# Patient Record
Sex: Female | Born: 1955 | Race: White | Hispanic: No | Smoking: Never smoker
Health system: Southern US, Community
[De-identification: ages and names within clinical notes are randomized; demographics above are authoritative.]

## PROBLEM LIST (undated history)

## (undated) DIAGNOSIS — N289 Disorder of kidney and ureter, unspecified: Secondary | ICD-10-CM

## (undated) DIAGNOSIS — N139 Obstructive and reflux uropathy, unspecified: Secondary | ICD-10-CM

## (undated) DIAGNOSIS — E119 Type 2 diabetes mellitus without complications: Secondary | ICD-10-CM

## (undated) DIAGNOSIS — I10 Essential (primary) hypertension: Secondary | ICD-10-CM

## (undated) DIAGNOSIS — K9413 Enterostomy malfunction: Secondary | ICD-10-CM

## (undated) HISTORY — PX: SMALL INTESTINE SURGERY: SHX150

## (undated) HISTORY — PX: ILEOSTOMY: SHX1783

## (undated) HISTORY — PX: CHOLECYSTECTOMY: SHX55

---

## 2004-05-03 ENCOUNTER — Ambulatory Visit: Payer: Self-pay | Admitting: Internal Medicine

## 2004-10-19 ENCOUNTER — Emergency Department: Payer: Self-pay | Admitting: Emergency Medicine

## 2005-05-04 ENCOUNTER — Emergency Department: Payer: Self-pay | Admitting: Emergency Medicine

## 2005-05-16 ENCOUNTER — Inpatient Hospital Stay: Payer: Self-pay | Admitting: Internal Medicine

## 2005-06-07 ENCOUNTER — Emergency Department: Payer: Self-pay | Admitting: Emergency Medicine

## 2005-10-31 ENCOUNTER — Other Ambulatory Visit: Payer: Self-pay

## 2005-10-31 ENCOUNTER — Emergency Department: Payer: Self-pay | Admitting: Unknown Physician Specialty

## 2005-11-02 ENCOUNTER — Other Ambulatory Visit: Payer: Self-pay

## 2005-11-02 ENCOUNTER — Inpatient Hospital Stay: Payer: Self-pay | Admitting: Internal Medicine

## 2005-11-03 ENCOUNTER — Other Ambulatory Visit: Payer: Self-pay

## 2006-01-28 ENCOUNTER — Emergency Department: Payer: Self-pay | Admitting: Emergency Medicine

## 2007-08-20 ENCOUNTER — Ambulatory Visit: Payer: Self-pay | Admitting: Internal Medicine

## 2007-09-08 ENCOUNTER — Inpatient Hospital Stay: Payer: Self-pay | Admitting: Internal Medicine

## 2007-09-08 ENCOUNTER — Ambulatory Visit: Payer: Self-pay | Admitting: Family Medicine

## 2007-09-08 ENCOUNTER — Other Ambulatory Visit: Payer: Self-pay

## 2009-11-04 IMAGING — CR DG CHEST 2V
1 series · 2 of 2 positions shown · non-contrast
Comparison: none

REASON FOR EXAM: congestive heart failure
COMMENTS:

[Series 1: view not recorded · 0.17mm/px · 2 of 2 slices shown]
[im 1/2]
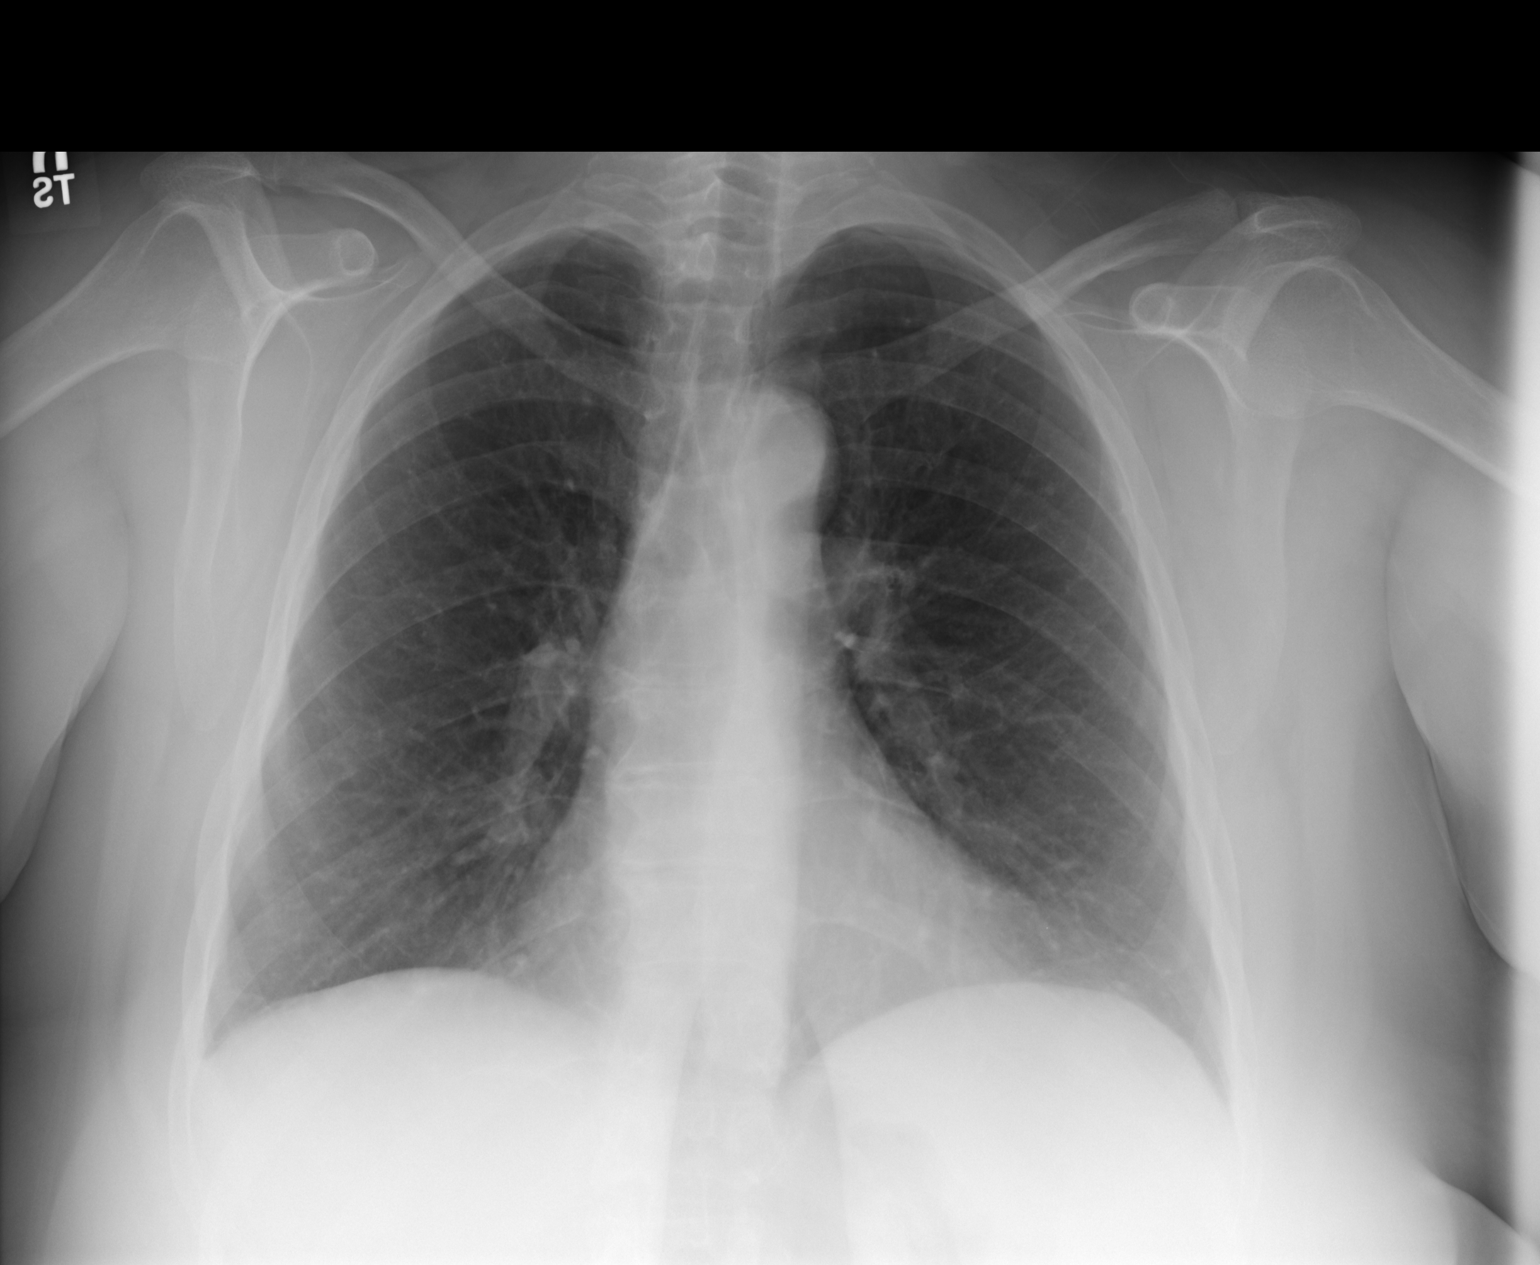
[im 2/2]
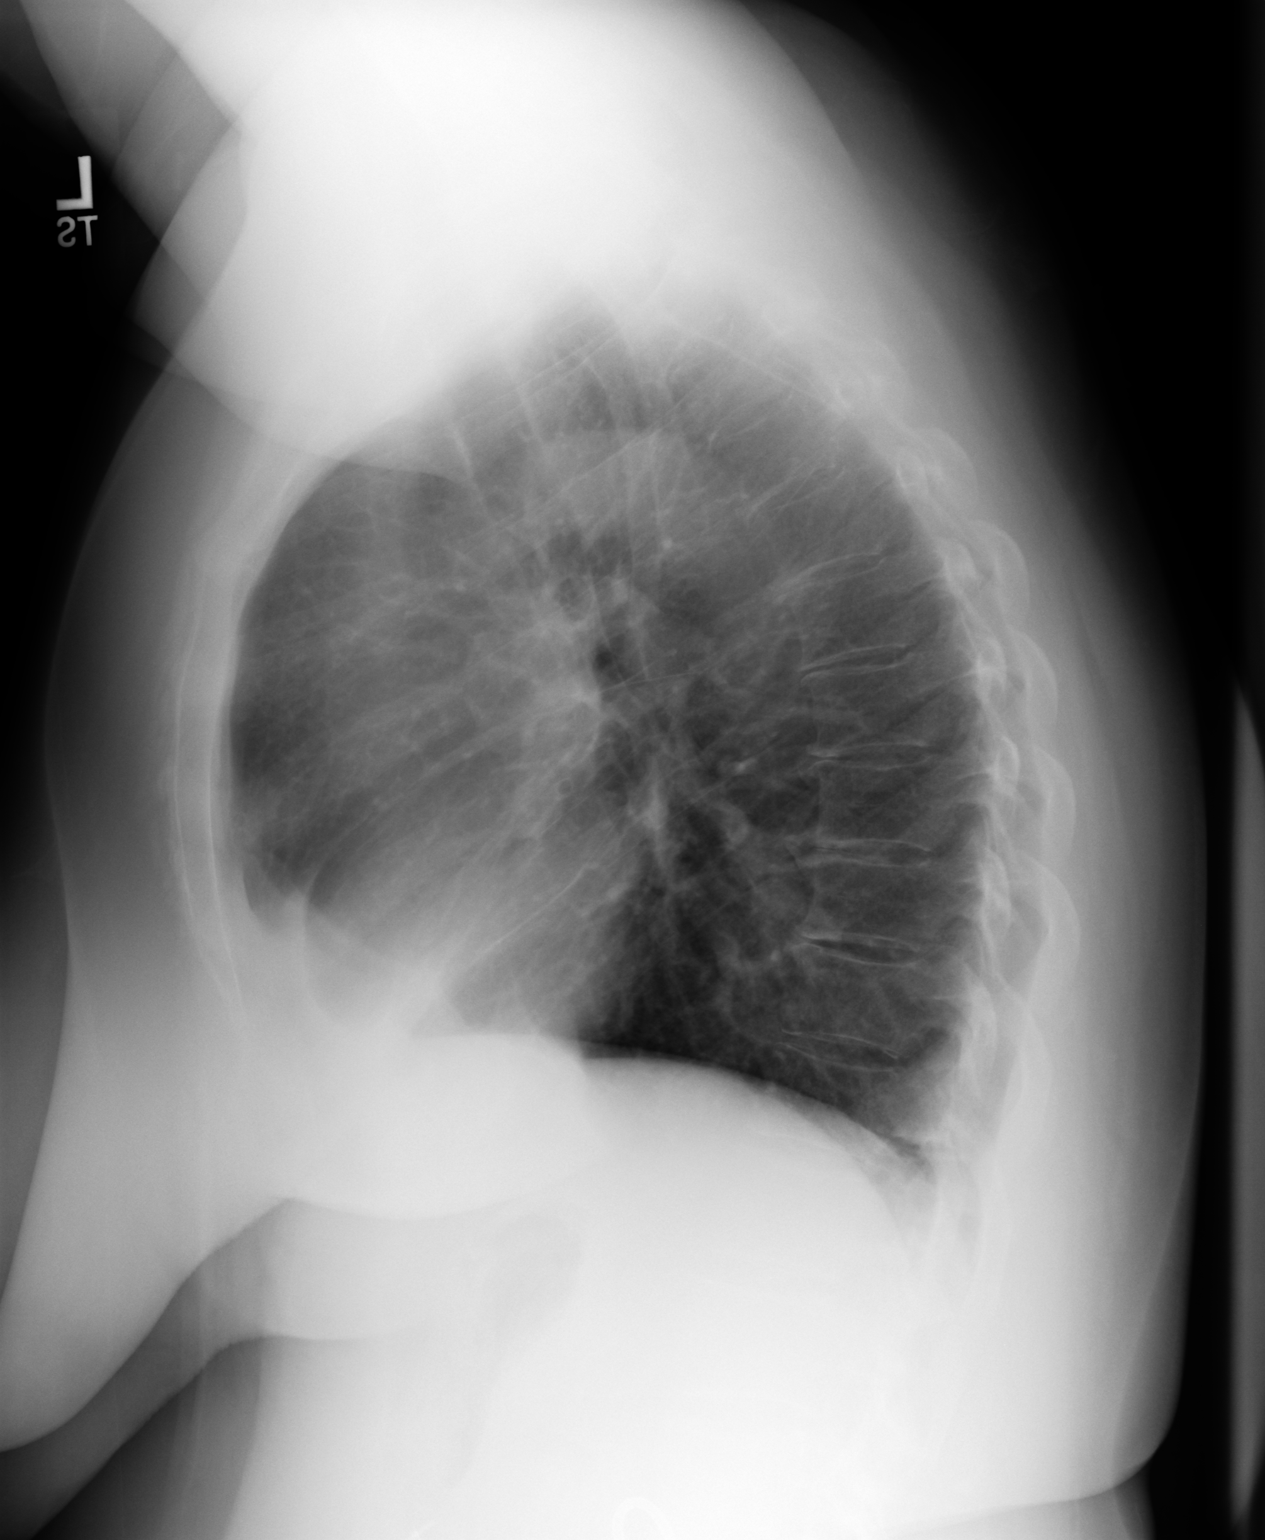

[2 of 2 positions shown; findings below may reference images not displayed]

PROCEDURE:     DXR - DXR CHEST PA (OR AP) AND LATERAL  - August 20, 2007 [DATE]

RESULT:     Comparison is made to a study 02 November, 2005.

The lungs are adequately inflated.  There is a suggestion of mild
hyperinflation with hemidiaphragm flattening on the lateral film. The heart
is normal in size. The pulmonary vascularity is not engorged. There is no
pleural effusion.
IMPRESSION: There is very mild hyperinflation which may be voluntary or
which could reflect an element of underlying COPD or reactive airway
disease. I see no overt evidence of CHF.

## 2011-05-14 ENCOUNTER — Encounter: Payer: Self-pay | Admitting: Family Medicine

## 2012-11-14 ENCOUNTER — Emergency Department: Payer: Self-pay | Admitting: Emergency Medicine

## 2015-06-14 ENCOUNTER — Ambulatory Visit
Admission: EM | Admit: 2015-06-14 | Discharge: 2015-06-14 | Disposition: A | Discharge status: Home / Self Care | Payer: Medicare Other | Attending: Family Medicine | Admitting: Family Medicine

## 2015-06-14 DIAGNOSIS — M79602 Pain in left arm: Secondary | ICD-10-CM | POA: Diagnosis not present

## 2015-06-14 DIAGNOSIS — M7989 Other specified soft tissue disorders: Secondary | ICD-10-CM

## 2015-06-14 HISTORY — DX: Obstructive and reflux uropathy, unspecified: N13.9

## 2015-06-14 HISTORY — DX: Type 2 diabetes mellitus without complications: E11.9

## 2015-06-14 HISTORY — DX: Essential (primary) hypertension: I10

## 2015-06-14 HISTORY — DX: Enterostomy malfunction: K94.13

## 2015-06-14 NOTE — ED Provider Notes (Addendum)
CSN: [id number]     Arrival date & time 06/14/15  1446 History   First MD Initiated Contact with Patient 06/14/15 1640     Chief Complaint  Patient presents with  . Arm Swelling    Left Arm  . Arm Pain    Left Arm   (Consider location/radiation/quality/duration/timing/severity/associated sxs/prior Treatment) HPI: Patient presents today with symptoms of left arm swelling and pain. Patient states that she noticed these symptoms yesterday. She denies any injury or fall. She denies any history of DVT or PE in the past. She denies any chest pain or shortness of breath. She denies any fever.  Past Medical History  Diagnosis Date  . Acute bilateral obstructive uropathy   . 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 Hypertension   . Diabetes mellitus without complication [facility])    Past Surgical History  Procedure Laterality Date  . Ileostomy    . Small intestine surgery    . Cholecystectomy     History reviewed. No pertinent family history. Social History  Substance Use Topics  . Smoking status: Never Smoker   . Smokeless tobacco: Never Used  . Alcohol Use: No   OB History    No data available     Review of Systems: Negative except mentioned above.   Allergies  Dilaudid; Keflex; Morphine and related; Biaxin; and Penicillins  Home Medications   Prior to Admission medications   Medication Sig Start Date End Date Taking? Authorizing Provider  amLODipine (NORVASC) 5 MG tablet Take 5 mg by mouth daily.   Yes Historical Provider, MD  aspirin 81 MG tablet Take 81 mg by mouth daily.   Yes Historical Provider, MD  calcium acetate (PHOSLO) 667 MG tablet Take 667 mg by mouth 2 (two) times daily.   Yes Historical Provider, MD  Calcium Carbonate-Vitamin D (CALCIUM 500 + D PO) Take 1 tablet by mouth daily.   Yes Historical Provider, MD  FERROUS SULFATE ER PO Take 65 mg by mouth daily.   Yes Historical Provider, MD  furosemide (LASIX) 40 MG tablet Take 40 mg by mouth 2 (two) times daily.   Yes  Historical Provider, MD  gabapentin (NEURONTIN) 300 MG capsule Take 300 mg by mouth at bedtime.   Yes Historical Provider, MD  glucagon (GLUCAGON EMERGENCY) 1 MG injection Inject 1 mg into the vein once as needed.   Yes Historical Provider, MD  insulin aspart (NOVOLOG) 100 UNIT/ML FlexPen Inject 14 Units into the skin 2 (two) times daily.   Yes Historical Provider, MD  insulin glargine (LANTUS) 100 UNIT/ML injection Inject 14 Units into the skin at bedtime.   Yes Historical Provider, MD  insulin NPH-regular Human (NOVOLIN 70/30) (70-30) 100 UNIT/ML injection Inject 38 Units into the skin daily with breakfast.   Yes Historical Provider, MD  insulin NPH-regular Human (NOVOLIN 70/30) (70-30) 100 UNIT/ML injection Inject 14 Units into the skin daily with supper.   Yes Historical Provider, MD  loperamide (IMODIUM) 2 MG capsule Take 2 mg by mouth as needed for diarrhea or loose stools.   Yes Historical Provider, MD  magnesium oxide (MAG-OX) 400 MG tablet Take 400 mg by mouth daily.   Yes Historical Provider, MD  metoprolol succinate (TOPROL-XL) 25 MG 24 hr tablet Take 25 mg by mouth daily.   Yes Historical Provider, MD  Multiple Vitamin (MULTIVITAMIN) tablet Take 1 tablet by mouth daily.   Yes Historical Provider, MD  omeprazole (PRILOSEC) 20 MG capsule Take 20 mg by mouth daily.   Yes Historical  Provider, MD  predniSONE (DELTASONE) 50 MG tablet Take 50 mg by mouth as directed.   Yes Historical Provider, MD  sodium bicarbonate 650 MG tablet Take 650 mg by mouth 2 (two) times daily.   Yes Historical Provider, MD   Meds Ordered and Administered this Visit  Medications - No data to display  BP 131/74 mmHg  Pulse 80  Temp(Src) 98.2 F (36.8 C) (Oral)  Resp 18  Ht 5\' 1"  (1.549 m)  Wt 207 lb (93.895 kg)  BMI 39.13 kg/m2  SpO2 99% No data found.   Physical Exam:  GENERAL: NAD RESP: CTA B CARD: RRR MSK: LUE- mild swelling and tenderness of LUE, some discomfort of LUE with ROM, nv intact  NEURO:  CN II-XII grossly intact   ED Course  Procedures (including critical care time)  Labs Review Labs Reviewed - No data to display  Imaging Review No results found.    MDM  A/P: Left upper extremity pain and swelling-discussed with patient that I would recommend that she get any ultrasound of the extremity at this time to rule out DVT, patient states that she will be going to the ER at this time. She has a family member with her that will take her.    Paulina Fusi, MD 06/14/15 [id number]  Paulina Fusi, MD 06/14/15 [id number]

## 2015-06-14 NOTE — ED Notes (Signed)
Patient c/o left arm swelling and pain which she noticed yesterday.  No injury or fall.  Denies fever c/n/v or chest pain.

## 2019-11-01 ENCOUNTER — Inpatient Hospital Stay: Payer: Medicare Other

## 2019-11-01 ENCOUNTER — Other Ambulatory Visit: Payer: Self-pay

## 2019-11-01 ENCOUNTER — Encounter: Payer: Self-pay | Admitting: Emergency Medicine

## 2019-11-01 ENCOUNTER — Inpatient Hospital Stay
Admission: EM | Admit: 2019-11-01 | Discharge: 2019-11-24 | DRG: 871 | Disposition: E | Payer: Medicare Other | Attending: Internal Medicine | Admitting: Internal Medicine

## 2019-11-01 ENCOUNTER — Emergency Department: Payer: Medicare Other

## 2019-11-01 DIAGNOSIS — Z881 Allergy status to other antibiotic agents status: Secondary | ICD-10-CM

## 2019-11-01 DIAGNOSIS — Z794 Long term (current) use of insulin: Secondary | ICD-10-CM

## 2019-11-01 DIAGNOSIS — L89623 Pressure ulcer of left heel, stage 3: Secondary | ICD-10-CM | POA: Diagnosis present

## 2019-11-01 DIAGNOSIS — Z992 Dependence on renal dialysis: Secondary | ICD-10-CM

## 2019-11-01 DIAGNOSIS — A419 Sepsis, unspecified organism: Principal | ICD-10-CM | POA: Diagnosis present

## 2019-11-01 DIAGNOSIS — Z6839 Body mass index (BMI) 39.0-39.9, adult: Secondary | ICD-10-CM | POA: Diagnosis not present

## 2019-11-01 DIAGNOSIS — N139 Obstructive and reflux uropathy, unspecified: Secondary | ICD-10-CM | POA: Diagnosis present

## 2019-11-01 DIAGNOSIS — E1140 Type 2 diabetes mellitus with diabetic neuropathy, unspecified: Secondary | ICD-10-CM | POA: Diagnosis present

## 2019-11-01 DIAGNOSIS — Z7982 Long term (current) use of aspirin: Secondary | ICD-10-CM

## 2019-11-01 DIAGNOSIS — Z20822 Contact with and (suspected) exposure to covid-19: Secondary | ICD-10-CM | POA: Diagnosis present

## 2019-11-01 DIAGNOSIS — J189 Pneumonia, unspecified organism: Secondary | ICD-10-CM | POA: Diagnosis present

## 2019-11-01 DIAGNOSIS — M4628 Osteomyelitis of vertebra, sacral and sacrococcygeal region: Secondary | ICD-10-CM | POA: Diagnosis present

## 2019-11-01 DIAGNOSIS — H9190 Unspecified hearing loss, unspecified ear: Secondary | ICD-10-CM | POA: Diagnosis present

## 2019-11-01 DIAGNOSIS — N186 End stage renal disease: Secondary | ICD-10-CM | POA: Diagnosis present

## 2019-11-01 DIAGNOSIS — E872 Acidosis, unspecified: Secondary | ICD-10-CM

## 2019-11-01 DIAGNOSIS — I429 Cardiomyopathy, unspecified: Secondary | ICD-10-CM | POA: Diagnosis present

## 2019-11-01 DIAGNOSIS — R54 Age-related physical debility: Secondary | ICD-10-CM | POA: Diagnosis present

## 2019-11-01 DIAGNOSIS — E1165 Type 2 diabetes mellitus with hyperglycemia: Secondary | ICD-10-CM | POA: Diagnosis present

## 2019-11-01 DIAGNOSIS — Z515 Encounter for palliative care: Secondary | ICD-10-CM | POA: Diagnosis not present

## 2019-11-01 DIAGNOSIS — Z7952 Long term (current) use of systemic steroids: Secondary | ICD-10-CM

## 2019-11-01 DIAGNOSIS — E1122 Type 2 diabetes mellitus with diabetic chronic kidney disease: Secondary | ICD-10-CM | POA: Diagnosis present

## 2019-11-01 DIAGNOSIS — R000 Tachycardia, unspecified: Secondary | ICD-10-CM | POA: Diagnosis present

## 2019-11-01 DIAGNOSIS — N2581 Secondary hyperparathyroidism of renal origin: Secondary | ICD-10-CM | POA: Diagnosis present

## 2019-11-01 DIAGNOSIS — I120 Hypertensive chronic kidney disease with stage 5 chronic kidney disease or end stage renal disease: Secondary | ICD-10-CM | POA: Diagnosis present

## 2019-11-01 DIAGNOSIS — E1169 Type 2 diabetes mellitus with other specified complication: Secondary | ICD-10-CM | POA: Diagnosis present

## 2019-11-01 DIAGNOSIS — I469 Cardiac arrest, cause unspecified: Secondary | ICD-10-CM | POA: Diagnosis not present

## 2019-11-01 DIAGNOSIS — D684 Acquired coagulation factor deficiency: Secondary | ICD-10-CM | POA: Diagnosis present

## 2019-11-01 DIAGNOSIS — R579 Shock, unspecified: Secondary | ICD-10-CM | POA: Diagnosis not present

## 2019-11-01 DIAGNOSIS — R6521 Severe sepsis with septic shock: Secondary | ICD-10-CM | POA: Diagnosis present

## 2019-11-01 DIAGNOSIS — Z9115 Patient's noncompliance with renal dialysis: Secondary | ICD-10-CM | POA: Diagnosis not present

## 2019-11-01 DIAGNOSIS — Z880 Allergy status to penicillin: Secondary | ICD-10-CM

## 2019-11-01 DIAGNOSIS — S31000A Unspecified open wound of lower back and pelvis without penetration into retroperitoneum, initial encounter: Secondary | ICD-10-CM

## 2019-11-01 DIAGNOSIS — L89150 Pressure ulcer of sacral region, unstageable: Secondary | ICD-10-CM | POA: Diagnosis present

## 2019-11-01 DIAGNOSIS — E875 Hyperkalemia: Secondary | ICD-10-CM | POA: Diagnosis present

## 2019-11-01 DIAGNOSIS — E8779 Other fluid overload: Secondary | ICD-10-CM

## 2019-11-01 DIAGNOSIS — J9600 Acute respiratory failure, unspecified whether with hypoxia or hypercapnia: Secondary | ICD-10-CM | POA: Diagnosis not present

## 2019-11-01 DIAGNOSIS — Z7189 Other specified counseling: Secondary | ICD-10-CM

## 2019-11-01 DIAGNOSIS — Z885 Allergy status to narcotic agent status: Secondary | ICD-10-CM

## 2019-11-01 DIAGNOSIS — D631 Anemia in chronic kidney disease: Secondary | ICD-10-CM | POA: Diagnosis present

## 2019-11-01 DIAGNOSIS — I82409 Acute embolism and thrombosis of unspecified deep veins of unspecified lower extremity: Secondary | ICD-10-CM

## 2019-11-01 DIAGNOSIS — Z932 Ileostomy status: Secondary | ICD-10-CM

## 2019-11-01 DIAGNOSIS — Z79899 Other long term (current) drug therapy: Secondary | ICD-10-CM

## 2019-11-01 DIAGNOSIS — Z9181 History of falling: Secondary | ICD-10-CM

## 2019-11-01 HISTORY — DX: Disorder of kidney and ureter, unspecified: N28.9

## 2019-11-01 LAB — BLOOD GAS, ARTERIAL
Acid-base deficit: 13.6 mmol/L — ABNORMAL HIGH (ref 0.0–2.0)
Bicarbonate: 11.5 mmol/L — ABNORMAL LOW (ref 20.0–28.0)
FIO2: 0.21
O2 Saturation: 97 %
Patient temperature: 37
pCO2 arterial: 24 mmHg — ABNORMAL LOW (ref 32.0–48.0)
pH, Arterial: 7.29 — ABNORMAL LOW (ref 7.350–7.450)
pO2, Arterial: 101 mmHg (ref 83.0–108.0)

## 2019-11-01 LAB — HEPATIC FUNCTION PANEL
ALT: 14 U/L (ref 0–44)
AST: 24 U/L (ref 15–41)
Albumin: 2.2 g/dL — ABNORMAL LOW (ref 3.5–5.0)
Alkaline Phosphatase: 470 U/L — ABNORMAL HIGH (ref 38–126)
Bilirubin, Direct: 0.4 mg/dL — ABNORMAL HIGH (ref 0.0–0.2)
Indirect Bilirubin: 0.9 mg/dL (ref 0.3–0.9)
Total Bilirubin: 1.3 mg/dL — ABNORMAL HIGH (ref 0.3–1.2)
Total Protein: 5.6 g/dL — ABNORMAL LOW (ref 6.5–8.1)

## 2019-11-01 LAB — CREATININE, SERUM
Creatinine, Ser: 6.56 mg/dL — ABNORMAL HIGH (ref 0.44–1.00)
GFR calc Af Amer: 7 mL/min — ABNORMAL LOW (ref >60)
GFR calc non Af Amer: 6 mL/min — ABNORMAL LOW (ref >60)

## 2019-11-01 LAB — CBC
HCT: 29.1 % — ABNORMAL LOW (ref 36.0–46.0)
HCT: 29.4 % — ABNORMAL LOW (ref 36.0–46.0)
Hemoglobin: 9.2 g/dL — ABNORMAL LOW (ref 12.0–15.0)
Hemoglobin: 9.3 g/dL — ABNORMAL LOW (ref 12.0–15.0)
MCH: 34.2 pg — ABNORMAL HIGH (ref 26.0–34.0)
MCH: 33.8 pg (ref 26.0–34.0)
MCHC: 31.6 g/dL (ref 30.0–36.0)
MCHC: 31.6 g/dL (ref 30.0–36.0)
MCV: 108.2 fL — ABNORMAL HIGH (ref 80.0–100.0)
MCV: 106.9 fL — ABNORMAL HIGH (ref 80.0–100.0)
Platelets: 386 10*3/uL (ref 150–400)
Platelets: 464 10*3/uL — ABNORMAL HIGH (ref 150–400)
RBC: 2.69 MIL/uL — ABNORMAL LOW (ref 3.87–5.11)
RBC: 2.75 MIL/uL — ABNORMAL LOW (ref 3.87–5.11)
RDW: 15.8 % — ABNORMAL HIGH (ref 11.5–15.5)
RDW: 15.7 % — ABNORMAL HIGH (ref 11.5–15.5)
WBC: 29.2 10*3/uL — ABNORMAL HIGH (ref 4.0–10.5)
WBC: 32 10*3/uL — ABNORMAL HIGH (ref 4.0–10.5)
nRBC: 0 % (ref 0.0–0.2)
nRBC: 0 % (ref 0.0–0.2)

## 2019-11-01 LAB — BASIC METABOLIC PANEL
Anion gap: 20 — ABNORMAL HIGH (ref 5–15)
Anion gap: 17 — ABNORMAL HIGH (ref 5–15)
BUN: 35 mg/dL — ABNORMAL HIGH (ref 8–23)
BUN: 36 mg/dL — ABNORMAL HIGH (ref 8–23)
CO2: 13 mmol/L — ABNORMAL LOW (ref 22–32)
CO2: 18 mmol/L — ABNORMAL LOW (ref 22–32)
Calcium: 8.3 mg/dL — ABNORMAL LOW (ref 8.9–10.3)
Calcium: 8.7 mg/dL — ABNORMAL LOW (ref 8.9–10.3)
Chloride: 93 mmol/L — ABNORMAL LOW (ref 98–111)
Chloride: 96 mmol/L — ABNORMAL LOW (ref 98–111)
Creatinine, Ser: 6.37 mg/dL — ABNORMAL HIGH (ref 0.44–1.00)
Creatinine, Ser: 6.45 mg/dL — ABNORMAL HIGH (ref 0.44–1.00)
GFR calc Af Amer: 7 mL/min — ABNORMAL LOW (ref >60)
GFR calc Af Amer: 7 mL/min — ABNORMAL LOW (ref >60)
GFR calc non Af Amer: 6 mL/min — ABNORMAL LOW (ref >60)
GFR calc non Af Amer: 6 mL/min — ABNORMAL LOW (ref >60)
Glucose, Bld: 168 mg/dL — ABNORMAL HIGH (ref 70–99)
Glucose, Bld: 105 mg/dL — ABNORMAL HIGH (ref 70–99)
Potassium: 5.3 mmol/L — ABNORMAL HIGH (ref 3.5–5.1)
Potassium: 5.5 mmol/L — ABNORMAL HIGH (ref 3.5–5.1)
Sodium: 126 mmol/L — ABNORMAL LOW (ref 135–145)
Sodium: 131 mmol/L — ABNORMAL LOW (ref 135–145)

## 2019-11-01 LAB — LACTIC ACID, PLASMA
Lactic Acid, Venous: 5.8 mmol/L (ref 0.5–1.9)
Lactic Acid, Venous: 3.7 mmol/L (ref 0.5–1.9)
Lactic Acid, Venous: 4 mmol/L (ref 0.5–1.9)
Lactic Acid, Venous: 3.3 mmol/L (ref 0.5–1.9)

## 2019-11-01 LAB — HEMOGLOBIN A1C
Hgb A1c MFr Bld: 4.7 % — ABNORMAL LOW (ref 4.8–5.6)
Mean Plasma Glucose: 88 mg/dL

## 2019-11-01 LAB — MRSA PCR SCREENING: MRSA by PCR: NEGATIVE

## 2019-11-01 LAB — TSH: TSH: 11.518 u[IU]/mL — ABNORMAL HIGH (ref 0.350–4.500)

## 2019-11-01 LAB — HEPATITIS B SURFACE ANTIBODY,QUALITATIVE: Hep B S Ab: NONREACTIVE

## 2019-11-01 LAB — PROTIME-INR
INR: 4.2 (ref 0.8–1.2)
Prothrombin Time: 39.2 seconds — ABNORMAL HIGH (ref 11.4–15.2)

## 2019-11-01 LAB — HIV ANTIBODY (ROUTINE TESTING W REFLEX): HIV Screen 4th Generation wRfx: NONREACTIVE

## 2019-11-01 LAB — PROCALCITONIN: Procalcitonin: 1.69 ng/mL

## 2019-11-01 LAB — GLUCOSE, CAPILLARY: Glucose-Capillary: 95 mg/dL (ref 70–99)

## 2019-11-01 LAB — HEPATITIS B SURFACE ANTIGEN: Hepatitis B Surface Ag: NONREACTIVE

## 2019-11-01 LAB — SARS CORONAVIRUS 2 BY RT PCR (HOSPITAL ORDER, PERFORMED IN ~~LOC~~ HOSPITAL LAB): SARS Coronavirus 2: NEGATIVE

## 2019-11-01 LAB — APTT: aPTT: 57 seconds — ABNORMAL HIGH (ref 24–36)

## 2019-11-01 MED ORDER — NOREPINEPHRINE 4 MG/250ML-% IV SOLN
INTRAVENOUS | Status: AC
  Filled 2019-11-01: qty 250

## 2019-11-01 MED ORDER — DOCUSATE SODIUM 100 MG PO CAPS: 100.0000 mg | ORAL_CAPSULE | Freq: Two times a day (BID) | ORAL | Status: DC | PRN

## 2019-11-01 MED ORDER — FENTANYL CITRATE (PF) 100 MCG/2ML IJ SOLN
INTRAMUSCULAR | Status: AC
  Administered 2019-11-01: 12.5 ug via INTRAVENOUS
  Filled 2019-11-01: qty 2

## 2019-11-01 MED ORDER — PRISMASOL BGK 0/2.5 32-2.5 MEQ/L IV SOLN
INTRAVENOUS | Status: DC
  Filled 2019-11-01: qty 5000

## 2019-11-01 MED ORDER — VANCOMYCIN HCL 1750 MG/350ML IV SOLN
1750.0000 mg | Freq: Once | INTRAVENOUS | Status: DC
  Filled 2019-11-01: qty 350

## 2019-11-01 MED ORDER — SODIUM BICARBONATE 8.4 % IV SOLN: 100.0000 meq | Freq: Once | INTRAVENOUS | Status: AC

## 2019-11-01 MED ORDER — SODIUM CHLORIDE 0.9 % IV SOLN
2.0000 g | Freq: Once | INTRAVENOUS | Status: AC
  Administered 2019-11-01: 2 g via INTRAVENOUS
  Filled 2019-11-01: qty 2

## 2019-11-01 MED ORDER — POLYETHYLENE GLYCOL 3350 17 G PO PACK: 17.0000 g | PACK | Freq: Every day | ORAL | Status: DC | PRN

## 2019-11-01 MED ORDER — CHLORHEXIDINE GLUCONATE CLOTH 2 % EX PADS: 6.0000 | MEDICATED_PAD | Freq: Every day | CUTANEOUS | Status: DC

## 2019-11-01 MED ORDER — SODIUM CHLORIDE 0.9 % FOR CRRT
INTRAVENOUS_CENTRAL | Status: DC | PRN
  Filled 2019-11-01: qty 1000

## 2019-11-01 MED ORDER — NOREPINEPHRINE 4 MG/250ML-% IV SOLN
0.0000 ug/min | INTRAVENOUS | Status: DC
  Administered 2019-11-01: 15 ug/min via INTRAVENOUS
  Administered 2019-11-01: 5 ug/min via INTRAVENOUS
  Filled 2019-11-01: qty 250

## 2019-11-01 MED ORDER — STERILE WATER FOR INJECTION IV SOLN
INTRAVENOUS | Status: DC
  Filled 2019-11-01 (×4): qty 850

## 2019-11-01 MED ORDER — FENTANYL CITRATE (PF) 100 MCG/2ML IJ SOLN: 12.5000 ug | Freq: Once | INTRAMUSCULAR | Status: AC

## 2019-11-01 MED ORDER — NOREPINEPHRINE 16 MG/250ML-% IV SOLN
0.0000 ug/min | INTRAVENOUS | Status: DC
  Administered 2019-11-01: 15 ug/min via INTRAVENOUS
  Administered 2019-11-02: 20 ug/min via INTRAVENOUS
  Filled 2019-11-01 (×3): qty 250

## 2019-11-01 MED ORDER — SODIUM BICARBONATE 8.4 % IV SOLN
INTRAVENOUS | Status: AC
  Administered 2019-11-01: 100 meq via INTRAVENOUS
  Filled 2019-11-01: qty 100

## 2019-11-01 MED ORDER — FENTANYL CITRATE (PF) 100 MCG/2ML IJ SOLN
25.0000 ug | Freq: Once | INTRAMUSCULAR | Status: AC
  Administered 2019-11-01: 25 ug via INTRAVENOUS
  Filled 2019-11-01: qty 2

## 2019-11-01 MED ORDER — SODIUM BICARBONATE-DEXTROSE 150-5 MEQ/L-% IV SOLN: 150.0000 meq | INTRAVENOUS | Status: DC

## 2019-11-01 MED ORDER — ALBUMIN HUMAN 25 % IV SOLN
50.0000 g | Freq: Once | INTRAVENOUS | Status: AC
  Administered 2019-11-01: 50 g via INTRAVENOUS
  Filled 2019-11-01: qty 200

## 2019-11-01 MED ORDER — MIDODRINE HCL 5 MG PO TABS
10.0000 mg | ORAL_TABLET | Freq: Once | ORAL | Status: DC
  Filled 2019-11-01: qty 2

## 2019-11-01 MED ORDER — SODIUM CHLORIDE 0.9% FLUSH
3.0000 mL | Freq: Once | INTRAVENOUS | Status: AC
  Administered 2019-11-01: 3 mL via INTRAVENOUS

## 2019-11-01 MED ORDER — FENTANYL CITRATE (PF) 100 MCG/2ML IJ SOLN
12.5000 ug | INTRAMUSCULAR | Status: DC | PRN
  Administered 2019-11-02 (×3): 12.5 ug via INTRAVENOUS
  Filled 2019-11-01 (×3): qty 2

## 2019-11-01 MED ORDER — HEPARIN SODIUM (PORCINE) 1000 UNIT/ML DIALYSIS
1000.0000 [IU] | INTRAMUSCULAR | Status: DC | PRN
  Filled 2019-11-01: qty 6

## 2019-11-01 MED ORDER — INSULIN ASPART 100 UNIT/ML ~~LOC~~ SOLN: 1.0000 [IU] | SUBCUTANEOUS | Status: DC

## 2019-11-01 MED ORDER — HEPARIN SODIUM (PORCINE) 5000 UNIT/ML IJ SOLN: 5000.0000 [IU] | Freq: Three times a day (TID) | INTRAMUSCULAR | Status: DC

## 2019-11-01 NOTE — Progress Notes (Signed)
Brief Pharmacy Note  Patient to start on CRRT. Review of current medications ordered. No medications requiring adjustment at this time. Will continue to monitor.  Dorena Bodo, PharmD

## 2019-11-01 NOTE — ED Provider Notes (Signed)
[facility] Emergency Department Provider Note ____________________________________________   First MD Initiated Contact with Patient 11/23/2019 1013     (approximate)  I have reviewed the triage vital signs and the nursing notes.  HISTORY  Chief Complaint Shortness of Breath   HPI Wendy Wiggins is a 64 y.o. femalewho presents to the ED for evaluation of shortness of breath.  Chart review indicates HTN, DM on insulin Daughter presents with the patient and provides much of the history. Patient provision of history is limited due to altered mental status, shortness of breath.  Daughter reports that patient recently started hemodialysis about 6 months ago, through [facility]. Typically T/Th/S schedule. Patient has not had iHD since Saturday July 31st.   Patient reports increasing shortness of breath and generalized pain.  She reports any pain at baseline, including lower back pain, and this is similar.  Reporting 8/10 intensity achiness throughout her body, primarily her legs and lower back.  Reports feeling "winded". Daughter reports that patient has been able to go to dialysis the week due to feeling weak and overall unwell.  She reports a fall on Thursday, difficulty getting up, requiring EMS for lift assistance, so she could not go then.  Denies recent fevers or focal complaints.   Past Medical History:  Diagnosis Date  . Acute bilateral obstructive uropathy   . Diabetes mellitus without complication ([place])   . Hypertension   . 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 Renal disorder     There are no problems to display for this patient.   Past Surgical History:  Procedure Laterality Date  . CHOLECYSTECTOMY    . ILEOSTOMY    . SMALL INTESTINE SURGERY      Prior to Admission medications   Medication Sig Start Date End Date Taking? Authorizing Provider  amLODipine (NORVASC) 5 MG tablet Take 5 mg by mouth daily.    [provider]  aspirin  81 MG tablet Take 81 mg by mouth daily.    [provider]  calcium acetate (PHOSLO) 667 MG tablet Take 667 mg by mouth 2 (two) times daily.    [provider]  Calcium Carbonate-Vitamin D (CALCIUM 500 + D PO) Take 1 tablet by mouth daily.    [provider]  FERROUS SULFATE ER PO Take 65 mg by mouth daily.    [provider]  furosemide (LASIX) 40 MG tablet Take 40 mg by mouth 2 (two) times daily.    [provider]  gabapentin (NEURONTIN) 300 MG capsule Take 300 mg by mouth at bedtime.    [provider]  glucagon (GLUCAGON EMERGENCY) 1 MG injection Inject 1 mg into the vein once as needed.    [provider]  insulin aspart (NOVOLOG) 100 UNIT/ML FlexPen Inject 14 Units into the skin 2 (two) times daily.    [provider]  insulin glargine (LANTUS) 100 UNIT/ML injection Inject 14 Units into the skin at bedtime.    [provider]  insulin NPH-regular Human (NOVOLIN 70/30) (70-30) 100 UNIT/ML injection Inject 38 Units into the skin daily with breakfast.    [provider]  insulin NPH-regular Human (NOVOLIN 70/30) (70-30) 100 UNIT/ML injection Inject 14 Units into the skin daily with supper.    [provider]  loperamide (IMODIUM) 2 MG capsule Take 2 mg by mouth as needed for diarrhea or loose stools.    [provider]  magnesium oxide (MAG-OX) 400 MG tablet Take 400 mg by mouth  daily.    [provider]  metoprolol succinate (TOPROL-XL) 25 MG 24 hr tablet Take 25 mg by mouth daily.    [provider]  Multiple Vitamin (MULTIVITAMIN) tablet Take 1 tablet by mouth daily.    [provider]  omeprazole (PRILOSEC) 20 MG capsule Take 20 mg by mouth daily.    [provider]  predniSONE (DELTASONE) 50 MG tablet Take 50 mg by mouth as directed.    [provider]  sodium bicarbonate 650 MG tablet Take 650 mg by mouth 2 (two) times daily.    [provider]    Allergies Dilaudid [hydromorphone hcl], Keflex [cephalexin], Morphine and related, Biaxin [clarithromycin], and Penicillins  No family history on file.  Social History Social History   Tobacco Use  . Smoking status: Never Smoker  . Smokeless tobacco: Never Used  Substance Use Topics  . Alcohol use: No  . Drug use: Not on file    Review of Systems  Limited based off of acuity and patient's altered mental status ____________________________________________   PHYSICAL EXAM:  VITAL SIGNS: Vitals:   10/28/2019 1314 10/27/2019 1340  BP: (!) 88/77 101/71  Pulse: (!) 119   Resp: 20 (!) 23  Temp:    SpO2: 100%       Constitutional: Alert and oriented.  Obese.  Supine in bed, moaning.  Hard of hearing.  Follows commands in all 4 extremities and answers questions appropriately.  Daughter at the bedside. Right-sided tunneled cath in place, clean dry intact. Eyes: Conjunctivae are normal. PERRL. EOMI. Head: Atraumatic. Nose: No congestion/rhinnorhea. Mouth/Throat: Mucous membranes are dry.  Oropharynx non-erythematous. Neck: No stridor. No cervical spine tenderness to palpation. Cardiovascular: Tachycardic and regular. Grossly normal heart sounds.  Good peripheral circulation. Respiratory:  No retractions.  Tachypneic to the low 20s.  Faint bibasilar crackles, otherwise clear. Gastrointestinal: Soft , nondistended, nontender to palpation. No abdominal bruits. No CVA tenderness. Musculoskeletal:No joint effusions. No signs of acute trauma. Neurologic:  Normal speech and language. No gross focal neurologic deficits are appreciated. No gait instability noted. Skin:  Skin is warm, dry and intact. No rash noted. Psychiatric: Mood and affect are normal. Speech and behavior are normal.  ____________________________________________   LABS (all labs ordered are listed, but only abnormal results are displayed)  Labs Reviewed  BASIC METABOLIC PANEL - Abnormal; Notable  for the following components:      Result Value   Sodium 126 (*)    Potassium 5.3 (*)    Chloride 93 (*)    CO2 13 (*)    Glucose, Bld 168 (*)    BUN 35 (*)    Creatinine, Ser 6.37 (*)    Calcium 8.3 (*)    GFR calc non Af Amer 6 (*)    GFR calc Af Amer 7 (*)    Anion gap 20 (*)    All other components within normal limits  CBC - Abnormal; Notable for the following components:   WBC 29.2 (*)    RBC 2.69 (*)    Hemoglobin 9.2 (*)    HCT 29.1 (*)    MCV 108.2 (*)    MCH 34.2 (*)    RDW 15.8 (*)    All other components within normal limits  LACTIC ACID, PLASMA - Abnormal; Notable for the following components:   Lactic Acid, Venous 5.8 (*)    All other components within normal limits  SARS CORONAVIRUS 2 BY RT PCR (HOSPITAL ORDER, [place]  [facility])  CULTURE, BLOOD (ROUTINE X 2)  CULTURE, BLOOD (ROUTINE X 2)  URINALYSIS, COMPLETE (UACMP) WITH MICROSCOPIC  CBG MONITORING, ED   ____________________________________________  12 Lead EKG  Sinus rhythm, rate of 118 bpm, normal axis and intervals.  No evidence of acute ischemia. ____________________________________________  RADIOLOGY  ED MD interpretation: CXR obtained and with evidence of volume overload, and with evidence of possible RML pneumonia. Repeat CXR to confirm central line placement, demonstrates good placement of left subclavian line.  Official radiology report(s): DG Chest Portable 1 View  Result Date: 11/06/2019 CLINICAL DATA:  Status post central line placement today. EXAM: PORTABLE CHEST 1 VIEW COMPARISON:  Single-view of the chest earlier today. FINDINGS: A new left subclavian central venous catheter is in place with the tip projecting near the superior cavoatrial junction. Right dialysis catheter is unchanged. No pneumothorax. Trace bilateral pleural effusions. Small focus of atelectasis in the right mid lung again seen. Right basilar atelectasis has improved. Heart size normal. Aortic  atherosclerosis. IMPRESSION: Left subclavian central venous catheter tip projects at the superior cavoatrial junction. Negative for pneumothorax. Improved right basilar atelectasis. Electronically Signed   By: Inge Rise M.D.   On: 11/10/2019 12:17   DG Chest Portable 1 View  Result Date: 11/06/2019 CLINICAL DATA:  Missed dialysis, hypotensive, leukocytosis, evaluate for infiltrate. EXAM: PORTABLE CHEST 1 VIEW COMPARISON:  Prior chest radiographs 11/15/2012 FINDINGS: Right-sided dialysis catheter with catheter tip projecting in the region of the upper right atrium. Partially imaged tubular density projecting in the region of the left hemiabdomen. Correlate with procedural history. There is ill-defined opacity within the right mid lung field. There is an apparent 7 mm spiculated nodule within the right lower lobe. Minimal atelectasis at the left lung base. No evidence of pleural effusion or pneumothorax. No acute bony abnormality identified. IMPRESSION: Apparent 7 mm spiculated nodule in the right lung base. A chest CT is recommended for further evaluation Ill-defined opacity within the right mid lung field which could reflect pneumonia, atelectasis or scarring. Minimal atelectasis within the left lung base. Electronically Signed   By: Kellie Simmering DO   On: 10/28/2019 11:27    ____________________________________________   PROCEDURES and INTERVENTIONS  Procedure(s) performed (including Critical Care):  .1-3 Lead EKG Interpretation Performed by: Vladimir Crofts, MD Authorized by: Vladimir Crofts, MD     Interpretation: abnormal     ECG rate:  120   ECG rate assessment: tachycardic     Rhythm: sinus tachycardia     Ectopy: none     Conduction: normal   .Central Line  Date/Time: 10/30/2019 3:30 PM Performed by: Vladimir Crofts, MD Authorized by: Vladimir Crofts, MD   Consent:    Consent obtained:  Verbal   Consent given by:  Patient   Risks discussed:  Arterial puncture, bleeding, pneumothorax  and infection   Alternatives discussed:  No treatment Anesthesia (see MAR for exact dosages):    Anesthesia method:  Local infiltration   Local anesthetic:  Lidocaine 1% w/o epi Procedure details:    Location:  L subclavian   Patient position:  Flat   Procedural supplies:  Triple lumen   Landmarks identified: yes     Ultrasound guidance: no     Number of attempts:  1   Successful placement: yes   Post-procedure details:    Post-procedure:  Dressing applied and line sutured   Assessment:  Blood return through all ports, free fluid flow, no pneumothorax on x-ray and placement verified by x-ray   Patient tolerance  of procedure:  Tolerated well, no immediate complications    Medications  norepinephrine (LEVOPHED) 4mg  in 248mL premix infusion (12 mcg/min Intravenous Rate/Dose Change 11/22/2019 1256)  fentaNYL (SUBLIMAZE) injection 25 mcg (has no administration in time range)  vancomycin (VANCOREADY) IVPB 1750 mg/350 mL (has no administration in time range)  ceFEPIme (MAXIPIME) 2 g in sodium chloride 0.9 % 100 mL IVPB (has no administration in time range)  sodium chloride flush (NS) 0.9 % injection 3 mL (3 mLs Intravenous Given 10/24/2019 1050)   CRITICAL CARE Performed by: Vladimir Crofts   Total critical care time: 90 minutes  Critical care time was exclusive of separately billable procedures and treating other patients.  Critical care was necessary to treat or prevent imminent or life-threatening deterioration.  Critical care was time spent personally by me on the following activities: development of treatment plan with patient and/or surrogate as well as nursing, discussions with consultants, evaluation of patient's response to treatment, examination of patient, obtaining history from patient or surrogate, ordering and performing treatments and interventions, ordering and review of laboratory studies, ordering and review of radiographic studies, pulse oximetry and re-evaluation of patient's  condition.  ____________________________________________   INITIAL IMPRESSION / ASSESSMENT AND PLAN / ED COURSE  64 year old ESRD patient presenting from home short of breath with evidence of volume overload and likely septic shock, requiring medical ICU admission and urgent dialysis.  Patient persistently hypotensive, afebrile and no significant hypoxia.  Exam with evidence of significant volume overload.  She is mentating fairly well and has a nonfocal neurologic exam with maps in the 40s when she presents.  Due to concern of significant volume overload due to lack of dialysis in about 10 days, significant fluid resuscitation was not pursued, and patient was rapidly started on peripheral Levophed.  This required significant up titration to maintain reasonable perfusion pressures, and so left subclavian central access was required to facilitate Levophed administration and recurrent blood draws of a patient this ill.  Blood work demonstrates significant lactic acidosis and leukocytosis, to suggest a possible septic etiology of her shock.  While her exam is most significant for her volume overload, I am concerned that her persistent hypotension and shocky state is due to a coexisting pathologies such as infection.  Blood cultures ordered and provided patient cefepime and vancomycin for broad-spectrum coverage, concerning for possible skin source such as her sacral ulcer or heel ulcer causing bacteremia.  Further concerns would be of a pulmonary source with a small infiltrate on her CXR causing her septic shock.  Intensivist called, who agrees admit the patient to the ICU.  Patient maintained good maps on up to 14 mcg/min of Levophed while in the ED.  Clinical Course as of Nov 01 1403  Mon Nov 01, 2019  1018 Initial assessment.  Patient volume overloaded with a nonfocal neurologic exam.  Dropped off in her room hypotensive, short of breath hypoxic on room air   [DS]  1051 Ultrasound IV placed by me.  BP  remains with maps in the 50s.  Patient mentating well.  Daughter at the bedside.   [DS]  [place]   [DS]  7322 Central line complete.  Levophed going at 5 mcg/min, map of 60   [DS]  0254 Spoke with Dr. Juleen China, nephro. Who will see the patient shortly   [DS]  1216 Spoke with intensivist on call, who will see the patient for admission   [DS]  1401 Reassessed.  12 mcg/min of Levophed.  ICU physician has already assessed the patient and he agrees to admit "if they can find room" in the ICU.   [DS]    Clinical Course User Index [DS] Vladimir Crofts, MD     ____________________________________________   FINAL CLINICAL IMPRESSION(S) / ED DIAGNOSES  Final diagnoses:  Shock ([place])  ESRD (end stage renal disease) on dialysis ([place])  Lactic acidosis  Sinus tachycardia  Hyperkalemia  Other hypervolemia  Sacral wound, initial encounter     ED Discharge Orders    None       Ryota Treece   Note:  This document was prepared using Dragon voice recognition software and may include unintentional dictation errors.   Vladimir Crofts, MD 11/12/2019 1536

## 2019-11-01 NOTE — ED Notes (Signed)
Pharmacy contacted for midodrine at this time, pharmacy states they will to ED.

## 2019-11-01 NOTE — ED Notes (Signed)
Dr. Tamala Julian aware of Lactic of 5.8.

## 2019-11-01 NOTE — ED Notes (Signed)
MD at bedside with ultrasound to obtain iv access at this time

## 2019-11-01 NOTE — ED Notes (Signed)
All 3 lumens on subclavian CL flushes easily and has blood return.

## 2019-11-01 NOTE — ED Notes (Signed)
Dr. Tamala Julian at bedside inserting centra line. Daughter at bedside.

## 2019-11-01 NOTE — Consult Note (Signed)
[facility]  CONSULT NOTE    Date: 11/17/2019                  Patient Name:  Wendy Wiggins  MRN: [id number]  DOB: February 11, 1956  Age / Sex: 64 y.o., female         PCP: No primary care provider on file.                 Service Requesting Consult: Dr. Francine Graven                 Reason for Consult: End Stage Renal Disease            History of Present Illness: Ms. Wendy Wiggins presents to ED with her daughter who assists with history taking. Patient missed dialysis for last three treatments. Last treatment was 7/31. Patient was getting IV antibiotics at dialysis but unclear what the infection was. She has a chronic sacral decubitus ulcer and history of osteomyelitis.   Presents to the ED with septic shock, hypotension and leukocytosis. Started on empiric antibiotics and given IV fluids. Placed on norepinephrine.    Medications: Outpatient medications: (Not in a hospital admission)   Current medications: Current Facility-Administered Medications  Medication Dose Route Frequency Provider Last Rate Last Admin  . ceFEPIme (MAXIPIME) 2 g in sodium chloride 0.9 % 100 mL IVPB  2 g Intravenous Once Vladimir Crofts, MD      . docusate sodium (COLACE) capsule 100 mg  100 mg Oral BID PRN Soyla Murphy, Maged, MD      . fentaNYL (SUBLIMAZE) injection 25 mcg  25 mcg Intravenous Once Vladimir Crofts, MD      . heparin injection 5,000 Units  5,000 Units Subcutaneous Q8H Samaan, Maged, MD      . norepinephrine (LEVOPHED) 4mg  in 271mL premix infusion  0-40 mcg/min Intravenous Continuous Vladimir Crofts, MD 45 mL/hr at 11/09/2019 1256 12 mcg/min at 11/18/2019 1256  . polyethylene glycol (MIRALAX / GLYCOLAX) packet 17 g  17 g Oral Daily PRN Samaan, Maged, MD      . vancomycin (VANCOREADY) IVPB 1750 mg/350 mL  1,750 mg Intravenous Once Vladimir Crofts, MD       Current Outpatient Medications  Medication Sig Dispense Refill  . amLODipine (NORVASC) 5 MG tablet Take 5 mg by mouth daily.    Marland Kitchen aspirin 81 MG  tablet Take 81 mg by mouth daily.    . calcium acetate (PHOSLO) 667 MG tablet Take 667 mg by mouth 2 (two) times daily.    . Calcium Carbonate-Vitamin D (CALCIUM 500 + D PO) Take 1 tablet by mouth daily.    Marland Kitchen FERROUS SULFATE ER PO Take 65 mg by mouth daily.    . furosemide (LASIX) 40 MG tablet Take 40 mg by mouth 2 (two) times daily.    Marland Kitchen gabapentin (NEURONTIN) 300 MG capsule Take 300 mg by mouth at bedtime.    Marland Kitchen glucagon (GLUCAGON EMERGENCY) 1 MG injection Inject 1 mg into the vein once as needed.    . insulin aspart (NOVOLOG) 100 UNIT/ML FlexPen Inject 14 Units into the skin 2 (two) times daily.    . insulin glargine (LANTUS) 100 UNIT/ML injection Inject 14 Units into the skin at bedtime.    . insulin NPH-regular Human (NOVOLIN 70/30) (70-30) 100 UNIT/ML injection Inject 38 Units into the skin daily with breakfast.    . insulin NPH-regular Human (NOVOLIN 70/30) (70-30) 100 UNIT/ML injection Inject 14 Units into the skin daily with supper.    Marland Kitchen  loperamide (IMODIUM) 2 MG capsule Take 2 mg by mouth as needed for diarrhea or loose stools.    . magnesium oxide (MAG-OX) 400 MG tablet Take 400 mg by mouth daily.    . metoprolol succinate (TOPROL-XL) 25 MG 24 hr tablet Take 25 mg by mouth daily.    . Multiple Vitamin (MULTIVITAMIN) tablet Take 1 tablet by mouth daily.    Marland Kitchen omeprazole (PRILOSEC) 20 MG capsule Take 20 mg by mouth daily.    . predniSONE (DELTASONE) 50 MG tablet Take 50 mg by mouth as directed.    . sodium bicarbonate 650 MG tablet Take 650 mg by mouth 2 (two) times daily.        Allergies: Allergies  Allergen Reactions  . Dilaudid [Hydromorphone Hcl] Other (See Comments)    Hallucinations  . Keflex [Cephalexin] Hives  . Morphine And Related Hives and Swelling  . Biaxin [Clarithromycin] Rash  . Penicillins Rash      Past Medical History: Past Medical History:  Diagnosis Date  . Acute bilateral obstructive uropathy   . Diabetes mellitus without complication ([place])   .  Hypertension   . 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 Renal disorder      Past Surgical History: Past Surgical History:  Procedure Laterality Date  . CHOLECYSTECTOMY    . ILEOSTOMY    . SMALL INTESTINE SURGERY       Family History: No family history on file.   Social History: Social History   Socioeconomic History  . Marital status: Divorced    Spouse name: Not on file  . Number of children: Not on file  . Years of education: Not on file  . Highest education level: Not on file  Occupational History  . Not on file  Tobacco Use  . Smoking status: Never Smoker  . Smokeless tobacco: Never Used  Substance and Sexual Activity  . Alcohol use: No  . Drug use: Not on file  . Sexual activity: Not on file  Other Topics Concern  . Not on file  Social History Narrative  . Not on file   Social Determinants of Health   Financial Resource Strain:   . Difficulty of Paying Living Expenses:   Food Insecurity:   . Worried About Charity fundraiser in the Last Year:   . Arboriculturist in the Last Year:   Transportation Needs:   . Film/video editor (Medical):   Marland Kitchen Lack of Transportation (Non-Medical):   Physical Activity:   . Days of Exercise per Week:   . Minutes of Exercise per Session:   Stress:   . Feeling of Stress :   Social Connections:   . Frequency of Communication with Friends and Family:   . Frequency of Social Gatherings with Friends and Family:   . Attends Religious Services:   . Active Member of Clubs or Organizations:   . Attends Archivist Meetings:   Marland Kitchen Marital Status:   Intimate Partner Violence:   . Fear of Current or Ex-Partner:   . Emotionally Abused:   Marland Kitchen Physically Abused:   . Sexually Abused:      Review of Systems: Review of Systems  Unable to perform ROS: Critical illness    Vital Signs: Blood pressure 91/71, pulse (!) 119, temperature 97.7 F (36.5 C), temperature source Oral, resp. rate 20, height 5\' 1"  (1.549 m), weight 94  kg, SpO2 100 %.  Weight trends: Filed Weights   10/27/2019 0956  Weight: 94 kg  Physical Exam: General: Critically ill  Head: Normocephalic, atraumatic. Moist oral mucosal membranes  Eyes: Anicteric, PERRL  Neck: Supple, trachea midline  Lungs:  Clear to auscultation  Heart: Regular rate and rhythm  Abdomen:  Soft, nontender, bilateral urostomy tubes, +ileostomy  Extremities:  +++ peripheral edema.  Neurologic: Nonfocal, moving all four extremities  Skin: No lesions  Access: RIJ permcath     Lab results: Basic Metabolic Panel: Recent Labs  Lab 11/19/2019 1054  NA 126*  K 5.3*  CL 93*  CO2 13*  GLUCOSE 168*  BUN 35*  CREATININE 6.37*  CALCIUM 8.3*    Liver Function Tests: No results for input(s): AST, ALT, ALKPHOS, BILITOT, PROT, ALBUMIN in the last 168 hours. No results for input(s): LIPASE, AMYLASE in the last 168 hours. No results for input(s): AMMONIA in the last 168 hours.  CBC: Recent Labs  Lab 11/23/2019 1054  WBC 29.2*  HGB 9.2*  HCT 29.1*  MCV 108.2*  PLT 386    Cardiac Enzymes: No results for input(s): CKTOTAL, CKMB, CKMBINDEX, TROPONINI in the last 168 hours.  BNP: Invalid input(s): POCBNP  CBG: No results for input(s): GLUCAP in the last 168 hours.  Microbiology: Results for orders placed or performed during the hospital encounter of 10/29/2019  SARS Coronavirus 2 by RT PCR (hospital order, performed in [facility] hospital lab) Nasopharyngeal Nasopharyngeal Swab     Status: None   Collection Time: 11/18/2019 11:28 AM   Specimen: Nasopharyngeal Swab  Result Value Ref Range Status   SARS Coronavirus 2 NEGATIVE NEGATIVE Final    Comment: (NOTE) SARS-CoV-2 target nucleic acids are NOT DETECTED.  The SARS-CoV-2 RNA is generally detectable in upper and lower respiratory specimens during the acute phase of infection. The lowest concentration of SARS-CoV-2 viral copies this assay can detect is 250 copies / mL. A negative result does not preclude  SARS-CoV-2 infection and should not be used as the sole basis for treatment or other patient management decisions.  A negative result may occur with improper specimen collection / handling, submission of specimen other than nasopharyngeal swab, presence of viral mutation(s) within the areas targeted by this assay, and inadequate number of viral copies (<250 copies / mL). A negative result must be combined with clinical observations, patient history, and epidemiological information.  Fact Sheet for Patients:   StrictlyIdeas.no  Fact Sheet for Healthcare Providers: BankingDealers.co.za  This test is not yet approved or  cleared by the [place] FDA and has been authorized for detection and/or diagnosis of SARS-CoV-2 by FDA under an Emergency Use Authorization (EUA).  This EUA will remain in effect (meaning this test can be used) for the duration of the COVID-19 declaration under Section 564(b)(1) of the Act, 21 U.S.C. section 360bbb-3(b)(1), unless the authorization is terminated or revoked sooner.  Performed at [facility], [address] [place], [place] [address]     Coagulation Studies: No results for input(s): LABPROT, INR in the last 72 hours.  Urinalysis: No results for input(s): COLORURINE, LABSPEC, PHURINE, GLUCOSEU, HGBUR, BILIRUBINUR, KETONESUR, PROTEINUR, UROBILINOGEN, NITRITE, LEUKOCYTESUR in the last 72 hours.  Invalid input(s): APPERANCEUR    Imaging: DG Chest Portable 1 View  Result Date: 11/23/2019 CLINICAL DATA:  Status post central line placement today. EXAM: PORTABLE CHEST 1 VIEW COMPARISON:  Single-view of the chest earlier today. FINDINGS: A new left subclavian central venous catheter is in place with the tip projecting near the superior cavoatrial junction. Right dialysis catheter is unchanged. No pneumothorax. Trace bilateral pleural  effusions. Small focus of atelectasis in the right mid lung  again seen. Right basilar atelectasis has improved. Heart size normal. Aortic atherosclerosis. IMPRESSION: Left subclavian central venous catheter tip projects at the superior cavoatrial junction. Negative for pneumothorax. Improved right basilar atelectasis. Electronically Signed   By: Inge Rise M.D.   On: 11/08/2019 12:17   DG Chest Portable 1 View  Result Date: 10/28/2019 CLINICAL DATA:  Missed dialysis, hypotensive, leukocytosis, evaluate for infiltrate. EXAM: PORTABLE CHEST 1 VIEW COMPARISON:  Prior chest radiographs 11/15/2012 FINDINGS: Right-sided dialysis catheter with catheter tip projecting in the region of the upper right atrium. Partially imaged tubular density projecting in the region of the left hemiabdomen. Correlate with procedural history. There is ill-defined opacity within the right mid lung field. There is an apparent 7 mm spiculated nodule within the right lower lobe. Minimal atelectasis at the left lung base. No evidence of pleural effusion or pneumothorax. No acute bony abnormality identified. IMPRESSION: Apparent 7 mm spiculated nodule in the right lung base. A chest CT is recommended for further evaluation Ill-defined opacity within the right mid lung field which could reflect pneumonia, atelectasis or scarring. Minimal atelectasis within the left lung base. Electronically Signed   By: Kellie Simmering DO   On: 10/25/2019 11:27      Assessment & Plan: Ms. Wendy Wiggins is a 64 y.o. white female with end stage renal disease on hemodialysis, hypertension, diabetes mellitus type II insulin dependent, bilateral obstructive uropathy, diabetic neuropathy, ileostomy and colectomy, who was admitted to [facility] on 11/08/2019 for Septic shock ([place]) [A41.9, R65.21]   [facility] MWF [place]  1. End stage renal disease: on hemodialysis. Will need renal replacement therapy today.  - Schedule CRRT.   2. Hypotension with septic shock: requiring vasopressors:  norepinephrine.  Holding home regimen of amlodipine, metoprolol, furosemide.  - empiric cefepime and vancomycin  3. Anemia of chronic kidney disease: macrocytic. Hemoglobin of 9.2. Will schedule EPO when more stable.   5. Secondary Hyperparathyroidism: Will hold home regimen of calcium acetate with meals.   LOS: 0 Trevontae Lindahl 8/9/20212:35 PM

## 2019-11-01 NOTE — ED Notes (Signed)
First nurse note  Presents with edema  Has missed several dialysis treatments

## 2019-11-01 NOTE — H&P (Addendum)
Name: Wendy Wiggins MRN: [id number] DOB: 1955-05-21     CONSULTATION DATE: 10/31/2019    HISTORY OF PRESENT ILLNESS:   64 years old Caucasian female with history of bilateral obstructive uropathy on bilateral nephrostomy, hypertension, ulcerative colitis on the functional ileostomy, end-stage renal disease on hemodialysis, sacral decub with osteomyelitis, obstructive uropathy on bilateral nephrostomies. Patient presented to the ED with generalized weakness and last 3 sessions of HD.  While in the ED she was found to have hypotension requiring titration of Levophed, elevated lactic acid and has been started on cefepime and vancomycin as per ED physician. All history was obtained from the daughter at the bedside as the patient was not able to provide detailed medical history because of lethargy.  Patient has home care and she was able to ambulate with a walker fell few weeks ago. No report of fever, shortness of breath, chest pain, bleeding and arrhythmia. Critical care consult and ICU admission was requested because requirement to titrate Levophed.  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  has a past surgical history that includes Ileostomy; Small intestine surgery; and Cholecystectomy. Prior to Admission medications   Medication Sig Start Date End Date Taking? Authorizing Provider  amLODipine (NORVASC) 5 MG tablet Take 5 mg by mouth daily.    [provider]  aspirin 81 MG tablet Take 81 mg by mouth daily.    [provider]  calcium acetate (PHOSLO) 667 MG tablet Take 667 mg by mouth 2 (two) times daily.    [provider]  Calcium Carbonate-Vitamin D (CALCIUM 500 + D PO) Take 1 tablet by mouth daily.    [provider]  FERROUS SULFATE ER PO Take 65 mg by mouth daily.    [provider]    furosemide (LASIX) 40 MG tablet Take 40 mg by mouth 2 (two) times daily.    [provider]  gabapentin (NEURONTIN) 300 MG capsule Take 300 mg by mouth at bedtime.    [provider]  glucagon (GLUCAGON EMERGENCY) 1 MG injection Inject 1 mg into the vein once as needed.    [provider]  insulin aspart (NOVOLOG) 100 UNIT/ML FlexPen Inject 14 Units into the skin 2 (two) times daily.    [provider]  insulin glargine (LANTUS) 100 UNIT/ML injection Inject 14 Units into the skin at bedtime.    [provider]  insulin NPH-regular Human (NOVOLIN 70/30) (70-30) 100 UNIT/ML injection Inject 38 Units into the skin daily with breakfast.    [provider]  insulin NPH-regular Human (NOVOLIN 70/30) (70-30) 100 UNIT/ML injection Inject 14 Units into the skin daily with supper.    [provider]  loperamide (IMODIUM) 2 MG capsule Take 2 mg by mouth as needed for diarrhea or loose stools.    [provider]  magnesium oxide (MAG-OX) 400 MG tablet Take 400 mg by mouth daily.    [provider]  metoprolol succinate (TOPROL-XL) 25 MG 24 hr tablet Take 25 mg by mouth daily.    [provider]  Multiple Vitamin (MULTIVITAMIN) tablet Take 1 tablet by mouth daily.    [provider]  omeprazole (PRILOSEC) 20 MG capsule Take 20 mg by mouth daily.    [provider]  predniSONE (DELTASONE) 50 MG tablet Take 50 mg by mouth as directed.    [provider]  sodium bicarbonate 650 MG tablet Take 650 mg by mouth 2 (two) times daily.    [provider]   Allergies  Allergen Reactions  . Dilaudid [Hydromorphone Hcl] Other (See Comments)    Hallucinations  . Keflex [Cephalexin] Hives  . Morphine And Related Hives and Swelling  . Biaxin [Clarithromycin] Rash  . Penicillins Rash    FAMILY HISTORY:  family history is not on file. SOCIAL HISTORY:  reports that she has never smoked. She has  never used smokeless tobacco. She reports that she does not drink alcohol.  REVIEW OF SYSTEMS:   Unable to obtain due to critical illness      Estimated body mass index is 39.16 kg/m as calculated from the following:   Height as of this encounter: 5\' 1"  (1.549 m).   Weight as of this encounter: 94 kg.    VITAL SIGNS: Temp:  [97.7 F (36.5 C)] 97.7 F (36.5 C) (08/09 0945) Pulse Rate:  [87-119] 119 (08/09 1425) Resp:  [11-23] 20 (08/09 1425) BP: (63-110)/(37-90) 91/71 (08/09 1425) SpO2:  [88 %-100 %] 100 % (08/09 1425) Weight:  [94 kg] 94 kg (08/09 0956)   No intake/output data recorded. No intake/output data recorded.   SpO2: 100 %   Physical Examination:  Awake, lethargic and no focal motor deficits Respiratory: No respiratory distress, tolerating room air.  Air entry is equal bilateral and no adventitious sounds Cardiovascular: S1 and S2 are audible with no murmur Benign abdomen, feeble peristalsis.  Ileostomy , and Bil nephrosotomy in place with clean site.  Ext: Left heel decub stage II.  Unstageable sacral decub.  Superficial abrasions right tibial chain and wasted extremities.  No peripheral edema.   Tunneled right IJ catheter for dialysis with clean site.  Left IJ TLC placed by ED physician .t hip decub stage II bdomen CULTURE RESULTS   Recent Results (from the past 240 hour(s))  SARS Coronavirus 2 by RT PCR (hospital order, performed in [facility] hospital lab) Nasopharyngeal Nasopharyngeal Swab     Status: None   Collection Time: 11/11/2019 11:28 AM   Specimen: Nasopharyngeal Swab  Result Value Ref Range Status   SARS Coronavirus 2 NEGATIVE NEGATIVE Final    Comment: (NOTE) SARS-CoV-2 target nucleic acids are NOT DETECTED.  The SARS-CoV-2 RNA is generally detectable in upper and lower respiratory specimens during the acute phase of infection. The lowest concentration of SARS-CoV-2 viral copies this assay can detect is 250 copies / mL. A negative result  does not preclude SARS-CoV-2 infection and should not be used as the sole basis for treatment or other patient management decisions.  A negative result may occur with improper specimen collection / handling, submission of specimen other than nasopharyngeal swab, presence of viral mutation(s) within the areas targeted by this assay, and inadequate number of viral copies (<250 copies / mL). A negative result must be combined with clinical observations, patient history, and epidemiological information.  Fact Sheet for Patients:   StrictlyIdeas.no  Fact Sheet for Healthcare Providers: BankingDealers.co.za  This test is not yet approved or  cleared by the [place] FDA and has been authorized for detection and/or diagnosis of SARS-CoV-2 by FDA under an Emergency Use Authorization (EUA).  This EUA will remain in effect (meaning this test can be used) for the duration of the COVID-19 declaration under Section 564(b)(1) of the Act, 21 U.S.C. section 360bbb-3(b)(1), unless the authorization is terminated or revoked sooner.  Performed at [facility], [place], [place]  [address]           IMAGING    DG Chest Portable 1 View  Result Date: 10/31/2019 CLINICAL DATA:  Status post central line placement today. EXAM: PORTABLE CHEST 1 VIEW COMPARISON:  Single-view of the chest earlier today. FINDINGS: A new left subclavian central venous catheter is in place with the tip projecting near the superior cavoatrial junction. Right dialysis catheter is unchanged. No pneumothorax. Trace bilateral pleural effusions. Small focus of atelectasis in the right mid lung again seen. Right basilar atelectasis has improved. Heart size normal. Aortic atherosclerosis. IMPRESSION: Left subclavian central venous catheter tip projects at the superior cavoatrial junction. Negative for pneumothorax. Improved right basilar atelectasis.  Electronically Signed   By: Inge Rise M.D.   On: 10/31/2019 12:17   DG Chest Portable 1 View  Result Date: 11/18/2019 CLINICAL DATA:  Missed dialysis, hypotensive, leukocytosis, evaluate for infiltrate. EXAM: PORTABLE CHEST 1 VIEW COMPARISON:  Prior chest radiographs 11/15/2012 FINDINGS: Right-sided dialysis catheter with catheter tip projecting in the region of the upper right atrium. Partially imaged tubular density projecting in the region of the left hemiabdomen. Correlate with procedural history. There is ill-defined opacity within the right mid lung field. There is an apparent 7 mm spiculated nodule within the right lower lobe. Minimal atelectasis at the left lung base. No evidence of pleural effusion or pneumothorax. No acute bony abnormality identified. IMPRESSION: Apparent 7 mm spiculated nodule in the right lung base. A chest CT is recommended for further evaluation Ill-defined opacity within the right mid lung field which could reflect pneumonia, atelectasis or scarring. Minimal atelectasis within the left lung base. Electronically Signed   By: Kellie Simmering DO   On: 10/31/2019 11:27       Assessment and plan:  Hypotension with septic shock. -Optimize albumin and vasopressors to maintain MAP more than 65 -Empiric cefepime and vancomycin.  Monitor blood cultures and lactic acid  Pneumonia.  Right lower and middle airspace disease with improved areation compared to previous radiologic exam  End-stage renal disease on HD with missed 3 sessions of dialysis.  Right tunneled IJ catheter for HD -RRT as per nephrology  Sacral decub unstageable with sacral osteomyelitis.  Evaluated by wound care on 10/26/2019. Left heel ulcer stage III Superficial abrasion right tibial shin -Follow with CT abd & pel  Uncontrolled diabetes mellitus -Optimize glycemic control, monitor POC glucose and HB A 1c. -Follows TSH  Rule out right upper extremity DVT  Anemia likely due to chronic disease  with no reported bleeding  Wound care consult.  Case was discussed with Dr. Juleen China from nephrology and the patient will be started on CVVHD as per renal. Plan of care was discussed with daughter at the bedside and they agreed to current management.  Critical care time 35 minutes

## 2019-11-01 NOTE — Progress Notes (Signed)
PIV consult: Arrived to ED while MD was assessing with ultrasound. Cancel consult.

## 2019-11-02 ENCOUNTER — Encounter: Payer: Self-pay | Admitting: Internal Medicine

## 2019-11-02 ENCOUNTER — Inpatient Hospital Stay
Admit: 2019-11-02 | Discharge: 2019-11-02 | Disposition: A | Discharge status: Home / Self Care | Payer: Medicare Other | Attending: Pulmonary Disease | Admitting: Pulmonary Disease

## 2019-11-02 DIAGNOSIS — R579 Shock, unspecified: Secondary | ICD-10-CM | POA: Diagnosis not present

## 2019-11-02 DIAGNOSIS — R6521 Severe sepsis with septic shock: Secondary | ICD-10-CM | POA: Diagnosis not present

## 2019-11-02 DIAGNOSIS — A419 Sepsis, unspecified organism: Secondary | ICD-10-CM | POA: Diagnosis not present

## 2019-11-02 DIAGNOSIS — Z515 Encounter for palliative care: Secondary | ICD-10-CM

## 2019-11-02 DIAGNOSIS — S31000A Unspecified open wound of lower back and pelvis without penetration into retroperitoneum, initial encounter: Secondary | ICD-10-CM

## 2019-11-02 DIAGNOSIS — Z7189 Other specified counseling: Secondary | ICD-10-CM

## 2019-11-02 DIAGNOSIS — N186 End stage renal disease: Secondary | ICD-10-CM

## 2019-11-02 DIAGNOSIS — Z992 Dependence on renal dialysis: Secondary | ICD-10-CM

## 2019-11-02 LAB — RENAL FUNCTION PANEL
Albumin: 3 g/dL — ABNORMAL LOW (ref 3.5–5.0)
Anion gap: 20 — ABNORMAL HIGH (ref 5–15)
BUN: 23 mg/dL (ref 8–23)
CO2: 17 mmol/L — ABNORMAL LOW (ref 22–32)
Calcium: 9 mg/dL (ref 8.9–10.3)
Chloride: 96 mmol/L — ABNORMAL LOW (ref 98–111)
Creatinine, Ser: 4.04 mg/dL — ABNORMAL HIGH (ref 0.44–1.00)
GFR calc Af Amer: 13 mL/min — ABNORMAL LOW (ref >60)
GFR calc non Af Amer: 11 mL/min — ABNORMAL LOW (ref >60)
Glucose, Bld: 166 mg/dL — ABNORMAL HIGH (ref 70–99)
Phosphorus: 4.7 mg/dL — ABNORMAL HIGH (ref 2.5–4.6)
Potassium: 4.2 mmol/L (ref 3.5–5.1)
Sodium: 133 mmol/L — ABNORMAL LOW (ref 135–145)

## 2019-11-02 LAB — CBC
HCT: 21.7 % — ABNORMAL LOW (ref 36.0–46.0)
Hemoglobin: 7.1 g/dL — ABNORMAL LOW (ref 12.0–15.0)
MCH: 34 pg (ref 26.0–34.0)
MCHC: 32.7 g/dL (ref 30.0–36.0)
MCV: 103.8 fL — ABNORMAL HIGH (ref 80.0–100.0)
Platelets: 405 10*3/uL — ABNORMAL HIGH (ref 150–400)
RBC: 2.09 MIL/uL — ABNORMAL LOW (ref 3.87–5.11)
RDW: 15.9 % — ABNORMAL HIGH (ref 11.5–15.5)
WBC: 20 10*3/uL — ABNORMAL HIGH (ref 4.0–10.5)
nRBC: 0 % (ref 0.0–0.2)

## 2019-11-02 LAB — HEPATITIS C ANTIBODY: HCV Ab: NONREACTIVE

## 2019-11-02 LAB — TYPE AND SCREEN
ABO/RH(D): A POS
Antibody Screen: NEGATIVE

## 2019-11-02 LAB — ECHOCARDIOGRAM COMPLETE
Area-P 1/2: 6.22 cm2
Calc EF: 10.1 %
Height: 61 in
S' Lateral: 3.44 cm
Single Plane A2C EF: 14.2 %
Single Plane A4C EF: 0.8 %
Weight: 2885.38 oz

## 2019-11-02 LAB — LACTIC ACID, PLASMA
Lactic Acid, Venous: 4.7 mmol/L (ref 0.5–1.9)
Lactic Acid, Venous: 7.5 mmol/L (ref 0.5–1.9)
Lactic Acid, Venous: 9.2 mmol/L (ref 0.5–1.9)

## 2019-11-02 LAB — GLUCOSE, CAPILLARY
Glucose-Capillary: 59 mg/dL — ABNORMAL LOW (ref 70–99)
Glucose-Capillary: 87 mg/dL (ref 70–99)

## 2019-11-02 LAB — ABO/RH: ABO/RH(D): A POS

## 2019-11-02 LAB — MAGNESIUM: Magnesium: 2 mg/dL (ref 1.7–2.4)

## 2019-11-02 LAB — PROTIME-INR
INR: 7 (ref 0.8–1.2)
Prothrombin Time: 58.6 seconds — ABNORMAL HIGH (ref 11.4–15.2)

## 2019-11-02 LAB — HEPATITIS B CORE ANTIBODY, IGM: Hep B C IgM: NONREACTIVE

## 2019-11-02 MED ORDER — ALBUMIN HUMAN 25 % IV SOLN
50.0000 g | Freq: Once | INTRAVENOUS | Status: AC
  Administered 2019-11-02: 50 g via INTRAVENOUS

## 2019-11-02 MED ORDER — VASOPRESSIN 20 UNITS/100 ML INFUSION FOR SHOCK
0.0000 [IU]/min | INTRAVENOUS | Status: DC
  Administered 2019-11-02: 0.03 [IU]/min via INTRAVENOUS
  Filled 2019-11-02: qty 100

## 2019-11-02 MED ORDER — ONDANSETRON HCL 4 MG/2ML IJ SOLN: 4.0000 mg | Freq: Four times a day (QID) | INTRAMUSCULAR | Status: DC | PRN

## 2019-11-02 MED ORDER — PHENYLEPHRINE CONCENTRATED 100MG/250ML (0.4 MG/ML) INFUSION SIMPLE
0.0000 ug/min | INTRAVENOUS | Status: DC
  Administered 2019-11-02: 400 ug/min via INTRAVENOUS
  Administered 2019-11-02: 100 ug/min via INTRAVENOUS
  Filled 2019-11-02 (×2): qty 250

## 2019-11-02 MED ORDER — SODIUM CHLORIDE 0.9% FLUSH: 10.0000 mL | INTRAVENOUS | Status: DC | PRN

## 2019-11-02 MED ORDER — LEVOFLOXACIN IN D5W 500 MG/100ML IV SOLN: 500.0000 mg | INTRAVENOUS | Status: DC

## 2019-11-02 MED ORDER — DEXTROSE 50 % IV SOLN
INTRAVENOUS | Status: AC
  Administered 2019-11-02: 25 g via INTRAVENOUS
  Filled 2019-11-02: qty 50

## 2019-11-02 MED ORDER — SODIUM CHLORIDE 0.9% FLUSH
10.0000 mL | Freq: Two times a day (BID) | INTRAVENOUS | Status: DC
  Administered 2019-11-02: 10 mL

## 2019-11-02 MED ORDER — DEXTROSE 50 % IV SOLN: 25.0000 g | Freq: Once | INTRAVENOUS | Status: AC

## 2019-11-02 MED ORDER — ONDANSETRON HCL 4 MG/2ML IJ SOLN
INTRAMUSCULAR | Status: AC
  Administered 2019-11-02: 4 mg via INTRAVENOUS
  Filled 2019-11-02: qty 2

## 2019-11-02 MED ORDER — LEVOFLOXACIN IN D5W 750 MG/150ML IV SOLN
750.0000 mg | Freq: Once | INTRAVENOUS | Status: AC
  Administered 2019-11-02: 750 mg via INTRAVENOUS
  Filled 2019-11-02: qty 150

## 2019-11-02 MED ORDER — HYDROCORTISONE NA SUCCINATE PF 100 MG IJ SOLR
50.0000 mg | Freq: Four times a day (QID) | INTRAMUSCULAR | Status: DC
  Administered 2019-11-02: 50 mg via INTRAVENOUS
  Filled 2019-11-02: qty 2

## 2019-11-03 LAB — GLUCOSE, CAPILLARY
Glucose-Capillary: 53 mg/dL — ABNORMAL LOW (ref 70–99)
Glucose-Capillary: 98 mg/dL (ref 70–99)

## 2019-11-04 LAB — GLUCOSE, CAPILLARY
Glucose-Capillary: 104 mg/dL — ABNORMAL HIGH (ref 70–99)
Glucose-Capillary: 91 mg/dL (ref 70–99)

## 2019-11-04 MED FILL — Sodium Chloride IV Soln 0.9%: INTRAVENOUS | Qty: 100 | Status: AC

## 2019-11-04 MED FILL — Vasopressin IV Soln 20 Unit/ML (For IV Infusion): INTRAVENOUS | Qty: 1 | Status: AC

## 2019-11-06 LAB — CULTURE, BLOOD (ROUTINE X 2)
Culture: NO GROWTH
Culture: NO GROWTH
Special Requests: ADEQUATE
Special Requests: ADEQUATE

## 2019-11-24 NOTE — Progress Notes (Signed)
[facility]  ROUNDING NOTE   Subjective:   CRRT overnight.  Placed on norepinephrine, vasopressin and phenylephrine.   Daughter at bedside.   Patient states she wants to be DNR. But this was then changed by daughter's prompting  Objective:  Vital signs in last 24 hours:  Temp:  [97.6 F (36.4 C)-97.9 F (36.6 C)] 97.9 F (36.6 C) (08/10 0400) Pulse Rate:  [87-135] 129 (08/10 0700) Resp:  [11-34] 21 (08/10 0700) BP: (63-121)/(37-90) 90/61 (08/10 0700) SpO2:  [88 %-100 %] 100 % (08/10 0700) Weight:  [81.8 kg-94 kg] 81.8 kg (08/10 0500)  Weight change:  Filed Weights   11/12/2019 0956 11/18/2019 1900 Nov 06, 2019 0500  Weight: 94 kg 82.3 kg 81.8 kg    Intake/Output: I/O last 3 completed shifts: In: 1009.5 [I.V.:700.9; IV Piggyback:308.5] Out: 915 [Urine:210; Other:705]   Intake/Output this shift:  No intake/output data recorded.  Physical Exam: General: Critically ill  Head: Normocephalic, atraumatic. Moist oral mucosal membranes  Eyes: Anicteric, PERRL  Neck: Supple, trachea midline  Lungs:  Diminished bilaterally  Heart: tachycardia  Abdomen:  +bilateral urostomy tubes, +ileostomy  Extremities:  ++ weeping peripheral edema.  Neurologic: Alert and oriented   Skin: No lesions  Access: RIJ permcath    Basic Metabolic Panel: Recent Labs  Lab 10/24/2019 1054 11/17/2019 1614 11/09/2019 2211 11-06-2019 0450  NA 126*  --  131* 133*  K 5.3*  --  5.5* 4.2  CL 93*  --  96* 96*  CO2 13*  --  18* 17*  GLUCOSE 168*  --  105* 166*  BUN 35*  --  36* 23  CREATININE 6.37* 6.56* 6.45* 4.04*  CALCIUM 8.3*  --  8.7* 9.0  MG  --   --   --  2.0  PHOS  --   --   --  4.7*    Liver Function Tests: Recent Labs  Lab 11/17/2019 1615 2019-11-06 0450  AST 24  --   ALT 14  --   ALKPHOS 470*  --   BILITOT 1.3*  --   PROT 5.6*  --   ALBUMIN 2.2* 3.0*   No results for input(s): LIPASE, AMYLASE in the last 168 hours. No results for input(s): AMMONIA in the last 168  hours.  CBC: Recent Labs  Lab 10/31/2019 1054 11/10/2019 1614 2019/11/06 0450  WBC 29.2* 32.0* 20.0*  HGB 9.2* 9.3* 7.1*  HCT 29.1* 29.4* 21.7*  MCV 108.2* 106.9* 103.8*  PLT 386 464* 405*    Cardiac Enzymes: No results for input(s): CKTOTAL, CKMB, CKMBINDEX, TROPONINI in the last 168 hours.  BNP: Invalid input(s): POCBNP  CBG: Recent Labs  Lab 11/07/2019 2008 November 06, 2019 0006  GLUCAP 95 87    Microbiology: Results for orders placed or performed during the hospital encounter of 11/09/2019  SARS Coronavirus 2 by RT PCR (hospital order, performed in [facility] hospital lab) Nasopharyngeal Nasopharyngeal Swab     Status: None   Collection Time: 10/27/2019 11:28 AM   Specimen: Nasopharyngeal Swab  Result Value Ref Range Status   SARS Coronavirus 2 NEGATIVE NEGATIVE Final    Comment: (NOTE) SARS-CoV-2 target nucleic acids are NOT DETECTED.  The SARS-CoV-2 RNA is generally detectable in upper and lower respiratory specimens during the acute phase of infection. The lowest concentration of SARS-CoV-2 viral copies this assay can detect is 250 copies / mL. A negative result does not preclude SARS-CoV-2 infection and should not be used as the sole basis for treatment or other patient management decisions.  A negative result may occur with improper specimen collection / handling, submission of specimen other than nasopharyngeal swab, presence of viral mutation(s) within the areas targeted by this assay, and inadequate number of viral copies (<250 copies / mL). A negative result must be combined with clinical observations, patient history, and epidemiological information.  Fact Sheet for Patients:   StrictlyIdeas.no  Fact Sheet for Healthcare Providers: BankingDealers.co.za  This test is not yet approved or  cleared by the [place] FDA and has been authorized for detection and/or diagnosis of SARS-CoV-2 by FDA under an Emergency Use  Authorization (EUA).  This EUA will remain in effect (meaning this test can be used) for the duration of the COVID-19 declaration under Section 564(b)(1) of the Act, 21 U.S.C. section 360bbb-3(b)(1), unless the authorization is terminated or revoked sooner.  Performed at [facility], [address] [place], [place] [address]   Blood culture (routine x 2)     Status: None (Preliminary result)   Collection Time: 11/20/2019  4:14 PM   Specimen: BLOOD  Result Value Ref Range Status   Specimen Description BLOOD LEFT ANTECUBITAL  Final   Special Requests   Final    BOTTLES DRAWN AEROBIC AND ANAEROBIC Blood Culture adequate volume   Culture   Final    NO GROWTH < 24 HOURS Performed at [facility], [address], [place], [place] [address]    Report Status PENDING  Incomplete  Blood culture (routine x 2)     Status: None (Preliminary result)   Collection Time: 11/21/2019  4:15 PM   Specimen: BLOOD  Result Value Ref Range Status   Specimen Description BLOOD LT CL  Final   Special Requests   Final    BOTTLES DRAWN AEROBIC AND ANAEROBIC Blood Culture adequate volume   Culture   Final    NO GROWTH < 24 HOURS Performed at [facility], [address] [place], [place] [address]    Report Status PENDING  Incomplete  MRSA PCR Screening     Status: None   Collection Time: 11/14/2019  6:31 PM   Specimen: Nasopharyngeal  Result Value Ref Range Status   MRSA by PCR NEGATIVE NEGATIVE Final    Comment:        The GeneXpert MRSA Assay (FDA approved for NASAL specimens only), is one component of a comprehensive MRSA colonization surveillance program. It is not intended to diagnose MRSA infection nor to guide or monitor treatment for MRSA infections. Performed at [facility], [address] [place], [place] [address]     Coagulation Studies: Recent Labs    11/12/2019 1614 2019/11/26 0450  LABPROT 39.2* 58.6*  INR 4.2* 7.0*    Urinalysis: No  results for input(s): COLORURINE, LABSPEC, PHURINE, GLUCOSEU, HGBUR, BILIRUBINUR, KETONESUR, PROTEINUR, UROBILINOGEN, NITRITE, LEUKOCYTESUR in the last 72 hours.  Invalid input(s): APPERANCEUR    Imaging: CT ABDOMEN PELVIS WO CONTRAST  Result Date: 11/10/2019 CLINICAL DATA:  64 year old female with sepsis. EXAM: CT ABDOMEN AND PELVIS WITHOUT CONTRAST TECHNIQUE: Multidetector CT imaging of the abdomen and pelvis was performed following the standard protocol without IV contrast. COMPARISON:  None. FINDINGS: Evaluation of this exam is limited in the absence of intravenous contrast. Lower chest: Small bilateral pleural effusions. Bibasilar subsegmental subpleural densities may represent atelectasis or infiltrate. Patchy area of consolidative change with air bronchogram in the right middle lobe may represent atelectasis or infiltrate. There is no intra-abdominal free air. Small free fluid in the upper abdomen. Hepatobiliary: The liver  is grossly unremarkable. No intrahepatic biliary ductal dilatation. Cholecystectomy. Pancreas: There is mild stranding adjacent to the distal pancreas which may be related to extension of fluid in the left paracolic gutter. Correlation with pancreatic enzymes recommended to exclude acute pancreatitis. Spleen: Normal in size without focal abnormality. Adrenals/Urinary Tract: Moderate to severe bilateral renal parenchyma atrophy. There are bilateral percutaneous nephrostomies with pigtail tip in the renal pelvis. No hydronephrosis. Several small right renal cysts suboptimally characterized on this CT. There is a 3 mm nonobstructing stone versus a focus of parenchymal calcification in the inferior pole of the left kidney. The visualized ureters and urinary bladder are unremarkable. Stomach/Bowel: There is postsurgical changes of bowel resection with a right lower quadrant ileostomy. There is a moderate size parastomal hernia containing several loops of small bowel. There is no bowel  obstruction. Vascular/Lymphatic: Advanced aortoiliac atherosclerotic disease. There is advanced calcification of the splenic and renal arteries. No portal venous gas. There is no adenopathy. Reproductive: Hysterectomy. Other: There is a 4.2 x 2.4 cm complex collection in the posterior pelvis (65/2) posterior to the rectum and inferior to the coccyx with extension into the skin surface posteriorly most concerning for an infected collection. This collection extends inferiorly to the skin surface along the intergluteal cleft. No definite drainable fluid collection. Musculoskeletal: There is a 4.3 x 4.9 cm soft tissue mass within the sacral bone with destruction of the bone. No acute fracture. There is small amount of fluid with small pockets of air in the fascia posterior to the paraspinal musculature. IMPRESSION: 1. Postsurgical changes of bowel resection with a right lower quadrant ileostomy. No bowel obstruction. 2. Destructive mass involving the sacrum with areas of bony fragmentation concerning for malignancy. 3. A 4.2 x 2.4 cm complex collection in the posterior pelvis inferior to the sacral mass most concerning for an infected collection. 4. Mild stranding adjacent to the distal pancreas may be related to extension of fluid in the left paracolic gutter. Correlation with pancreatic enzymes recommended to exclude acute pancreatitis. 5. Small bilateral pleural effusions with bibasilar subsegmental atelectasis or infiltrate. Patchy area of consolidative change with air bronchogram in the right middle lobe may represent atelectasis or infiltrate. 6. Aortic Atherosclerosis (ICD10-I70.0). Electronically Signed   By: Anner Crete M.D.   On: 11/12/2019 18:15   US Venous Img Upper Uni Right(DVT)  Result Date: 11/22/2019 CLINICAL DATA:  Right upper extremity swelling for 1 month. EXAM: RIGHT UPPER EXTREMITY VENOUS DOPPLER ULTRASOUND TECHNIQUE: Gray-scale sonography with graded compression, as well as color Doppler  and duplex ultrasound were performed to evaluate the upper extremity deep venous system from the level of the subclavian vein and including the jugular, axillary, basilic, radial, ulnar and upper cephalic vein. Spectral Doppler was utilized to evaluate flow at rest and with distal augmentation maneuvers. COMPARISON:  None. FINDINGS: Contralateral Subclavian Vein: Respiratory phasicity is normal and symmetric with the symptomatic side. No evidence of thrombus. Normal compressibility. Internal Jugular Vein: No evidence of thrombus. Normal compressibility, respiratory phasicity and response to augmentation. Subclavian Vein: No evidence of thrombus. Normal compressibility, respiratory phasicity and response to augmentation. Axillary Vein: No evidence of thrombus. Normal compressibility, respiratory phasicity and response to augmentation. Cephalic Vein: No evidence of thrombus. Normal compressibility, respiratory phasicity and response to augmentation. Basilic Vein: No evidence of thrombus. Normal compressibility, respiratory phasicity and response to augmentation. Brachial Veins: No evidence of thrombus. Normal compressibility, respiratory phasicity and response to augmentation. Radial Veins: No evidence of thrombus. Normal compressibility, respiratory phasicity and response  to augmentation. Ulnar Veins: No evidence of thrombus. Normal compressibility, respiratory phasicity and response to augmentation. Venous Reflux:  None visualized. Other Findings:  None visualized. IMPRESSION: No evidence of DVT within the right upper extremity. Electronically Signed   By: Inge Rise M.D.   On: 10/27/2019 15:55   DG Chest Portable 1 View  Result Date: 10/31/2019 CLINICAL DATA:  Status post central line placement today. EXAM: PORTABLE CHEST 1 VIEW COMPARISON:  Single-view of the chest earlier today. FINDINGS: A new left subclavian central venous catheter is in place with the tip projecting near the superior cavoatrial  junction. Right dialysis catheter is unchanged. No pneumothorax. Trace bilateral pleural effusions. Small focus of atelectasis in the right mid lung again seen. Right basilar atelectasis has improved. Heart size normal. Aortic atherosclerosis. IMPRESSION: Left subclavian central venous catheter tip projects at the superior cavoatrial junction. Negative for pneumothorax. Improved right basilar atelectasis. Electronically Signed   By: Inge Rise M.D.   On: 10/31/2019 12:17   DG Chest Portable 1 View  Result Date: 11/07/2019 CLINICAL DATA:  Missed dialysis, hypotensive, leukocytosis, evaluate for infiltrate. EXAM: PORTABLE CHEST 1 VIEW COMPARISON:  Prior chest radiographs 11/15/2012 FINDINGS: Right-sided dialysis catheter with catheter tip projecting in the region of the upper right atrium. Partially imaged tubular density projecting in the region of the left hemiabdomen. Correlate with procedural history. There is ill-defined opacity within the right mid lung field. There is an apparent 7 mm spiculated nodule within the right lower lobe. Minimal atelectasis at the left lung base. No evidence of pleural effusion or pneumothorax. No acute bony abnormality identified. IMPRESSION: Apparent 7 mm spiculated nodule in the right lung base. A chest CT is recommended for further evaluation Ill-defined opacity within the right mid lung field which could reflect pneumonia, atelectasis or scarring. Minimal atelectasis within the left lung base. Electronically Signed   By: Kellie Simmering DO   On: 11/23/2019 11:27     Medications:   . [START ON 11/04/2019] levofloxacin (LEVAQUIN) IV    . norepinephrine (LEVOPHED) Adult infusion 20 mcg/min (2019/11/10 0845)  . phenylephrine (NEO-SYNEPHRINE) Adult infusion 150 mcg/min (11/10/19 0700)  . prismasol BGK 2/2.5 dialysis solution 2,000 mL/hr at 10-Nov-2019 0801  . prismasol BGK 2/2.5 replacement solution 400 mL/hr at 11/18/2019 2220  . prismasol BGK 2/2.5 replacement solution     .  sodium bicarbonate (isotonic) infusion in sterile water 70 mL/hr at 10/29/2019 2220  . vancomycin    . vasopressin 0.03 Units/min (11-10-2019 0700)   . Chlorhexidine Gluconate Cloth  6 each Topical Daily  . hydrocortisone sod succinate (SOLU-CORTEF) inj  50 mg Intravenous Q6H  . insulin aspart  1-3 Units Subcutaneous Q4H  . sodium chloride flush  10-40 mL Intracatheter Q12H   docusate sodium, fentaNYL (SUBLIMAZE) injection, heparin, ondansetron (ZOFRAN) IV, polyethylene glycol, sodium chloride, sodium chloride flush  Assessment/ Plan:  Ms. Wendy Wiggins is a 64 y.o. white female with end stage renal disease on hemodialysis, hypertension, diabetes mellitus type II insulin dependent, bilateral obstructive uropathy, diabetic neuropathy, ileostomy and colectomy, who was admitted to [facility] on 10/25/2019 for Septic shock ([place]) [A41.9, R65.21]   [facility] [place]  1. End stage renal disease: on hemodialysis.  Complications of metabolic acidosis, hyponatremia  Hemodynamically unstable requiring vasopressors.  - Continue CRRT. 2K bath. UF rate of -75mL/hr  2. Hypotension with septic shock: requiring vasopressors: norepinephrine, phenylephrine and vasopressin Holding home regimen of amlodipine, metoprolol, furosemide.  Empiric levofloxacin Stress dose hydrocortisone  Echocardiogram pending CT with osteomyelitis of sacral wound.   3. Anemia of chronic kidney disease: macrocytic. Hemoglobin of 7.1 Low threshold for PRBC transfusion  5. Secondary Hyperparathyroidism: Will hold home regimen of calcium acetate with meals. Monitor bone and mineral metabolism labs.   Overall prognosis is poor. Consulted palliative care   LOS: [place] 08-11-20219:15 AM

## 2019-11-24 NOTE — Procedures (Signed)
Indication of intubation- Acute respiratory failure with cardiopulmonary arrest  Patient was successively intubated with 7.5 [place] ET tube using the Glide scope  ET tube position was verified with visualizing the ET tube passing through the vocal cords, color change on CO2 detector and bilateral equal air entry.

## 2019-11-24 NOTE — Progress Notes (Signed)
Pharmacy Antibiotic Note  Wendy Wiggins is a 64 y.o. female admitted on 11/18/2019 with wound infection.  Pharmacy has been consulted for levaquin dosing.  Plan: Will start levaquin 750 mg IV x 1 then 500 mg IV q48h per CrCl < 20 ml/min and will continue to monitor and adjust dose per changes in renal function.  QTc 428 on EKG 8/9 WNL  Height: 5\' 1"  (154.9 cm) Weight: 81.8 kg (180 lb 5.4 oz) IBW/kg (Calculated) : 47.8  Temp (24hrs), Avg:97.8 F (36.6 C), Min:97.6 F (36.4 C), Max:97.9 F (36.6 C)  Recent Labs  Lab 11/11/2019 1054 10/31/2019 1132 11/22/2019 1614 11/13/2019 1908 11/10/2019 2211 2019/11/14 0225 11-14-2019 0450  WBC 29.2*  --  32.0*  --   --   --  20.0*  CREATININE 6.37*  --  6.56*  --  6.45*  --  4.04*  LATICACIDVEN  --    < > 3.7* 4.0* 3.3* 4.7* 7.5*   < > = values in this interval not displayed.    Estimated Creatinine Clearance: 13.5 mL/min (A) (by C-G formula based on SCr of 4.04 mg/dL (H)).    Allergies  Allergen Reactions  . Morphine And Related Hives and Swelling  . Dilaudid [Hydromorphone Hcl] Other (See Comments)    Hallucinations  . Keflex [Cephalexin] Hives  . Biaxin [Clarithromycin] Rash  . Penicillins Rash    Thank you for allowing pharmacy to be a part of this patient's care.  Tobie Lords, PharmD, BCPS Clinical Pharmacist 14-Nov-2019 6:14 AM

## 2019-11-24 NOTE — Progress Notes (Addendum)
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  has a past surgical history that includes Ileostomy; Small intestine surgery; and Cholecystectomy. Prior to Admission medications   Medication Sig Start Date End Date Taking? Authorizing Provider  calcium-vitamin D (OSCAL WITH D) 500-200 MG-UNIT TABS tablet Take 1 tablet by mouth daily.   Yes [provider]  cetirizine (ZYRTEC) 10 MG tablet Take by mouth.   Yes [provider]  cyanocobalamin 1000 MCG tablet Take by mouth.   Yes [provider]  ELIQUIS 5 MG TABS tablet SMARTSIG:1 Tablet(s) By Mouth Every 12 Hours 10/24/19  Yes [provider]  FERROUS SULFATE ER PO Take 65 mg by mouth daily.   Yes [provider]  folic acid (FOLVITE) 188 MCG tablet Take by mouth.   Yes [provider]  furosemide (LASIX) 40 MG tablet Take 40 mg by mouth 2 (two) times daily. Take 40 mg. By mouth as directed for Edema. Take 40 mg. On Mon., Wed., Fri.,& Sun.   Yes [provider]  gabapentin (NEURONTIN) 100 MG capsule Take by mouth. Take 100 mg. TID On Mon., Wed., Fri.,& Sun. Take 300 mg. Tue., Thu., Sat. 09/27/19  Yes [provider]  insulin NPH-regular Human (NOVOLIN 70/30) (70-30) 100 UNIT/ML injection Inject 15 Units into the skin in the morning and at bedtime.    Yes [provider]  lansoprazole (PREVACID) 15 MG capsule Take 15 mg by mouth daily.   Yes [provider]  magnesium oxide (MAG-OX) 400 MG tablet Take 400 mg by mouth in the morning, at noon, and at bedtime.    Yes [provider]  midodrine (PROAMATINE) 10 MG tablet Take 10 mg by mouth every Tuesday, Thursday, Saturday, 1 hour prior to dialysis. 09/29/19  Yes  [provider]  midodrine (PROAMATINE) 2.5 MG tablet Take 2.5 mg by mouth 3 (three) times daily. 10/08/19  Yes [provider]  multivitamin (RENA-VIT) TABS tablet Take 1 tablet by mouth daily. 09/02/19  Yes [provider]  nortriptyline (PAMELOR) 10 MG capsule Take 10 mg by mouth at bedtime. 09/27/19  Yes [provider]  predniSONE (DELTASONE) 50 MG tablet Take 50 mg by mouth as directed. Take 1 tablet 13 hours before scan, Take 1 tablet 7 hours before scan, Take 1 tablet 1 hour before scan.   Yes [provider]  primidone (MYSOLINE) 50 MG tablet Take 50 mg by mouth at bedtime. 10/11/19  Yes [provider]  sevelamer carbonate (RENVELA) 800 MG tablet Take 800 mg by mouth 3 (three) times daily with meals.   Yes [provider]  simvastatin (ZOCOR) 10 MG tablet Take 10 mg by mouth at bedtime. 10/24/19  Yes [provider]  tiZANidine (ZANAFLEX) 4 MG tablet Take 4 mg by mouth 3 (three) times daily. Hold on Ciprofloxacin 10/24/19  Yes [provider]  Turmeric 500 MG CAPS Take by mouth.   Yes [provider]  VICTOZA 18 MG/3ML SOPN Inject 0.6 mg into the skin at bedtime.  10/21/19  Yes [provider]   Allergies  Allergen Reactions  . Morphine And Related Hives and Swelling  . Dilaudid [Hydromorphone Hcl] Other (See Comments)    Hallucinations  . Keflex [Cephalexin] Hives  . Biaxin [Clarithromycin] Rash  .  Penicillins Rash    FAMILY HISTORY:  family history is not on file. SOCIAL HISTORY:  reports that she has never smoked. She has never used smokeless tobacco. She reports that she does not drink alcohol.  REVIEW OF SYSTEMS:   Unable to obtain due to critical illness  Pressure Injury 11/15/2019 Sacrum Lower Unstageable - Full thickness tissue loss in which the base of the injury is covered by slough (yellow, tan, gray, green or brown) and/or eschar (tan, brown or black) in the wound bed. (Active)    11/12/2019 2000  Location: Sacrum  Location Orientation: Lower  Staging: Unstageable - Full thickness tissue loss in which the base of the injury is covered by slough (yellow, tan, gray, green or brown) and/or eschar (tan, brown or black) in the wound bed.  Wound Description (Comments):   Present on Admission: Yes     Pressure Injury 07-Nov-2019 Heel Left Stage 3 -  Full thickness tissue loss. Subcutaneous fat may be visible but bone, tendon or muscle are NOT exposed. (Active)  11/07/2019   Location: Heel  Location Orientation: Left  Staging: Stage 3 -  Full thickness tissue loss. Subcutaneous fat may be visible but bone, tendon or muscle are NOT exposed.  Wound Description (Comments):   Present on Admission: Yes     Estimated body mass index is 34.07 kg/m as calculated from the following:   Height as of this encounter: 5\' 1"  (1.549 m).   Weight as of this encounter: 81.8 kg.    VITAL SIGNS: Temp:  [97.6 F (36.4 C)-98 F (36.7 C)] 98 F (36.7 C) (08/10 0800) Pulse Rate:  [114-135] 133 (08/10 0900) Resp:  [11-38] 38 (08/10 1000) BP: (61-121)/(16-90) 71/59 (08/10 1000) SpO2:  [98 %-100 %] 100 % (08/10 0900) Weight:  [81.8 kg-82.3 kg] 81.8 kg (08/10 0500)   I/O last 3 completed shifts: In: 1009.5 [I.V.:700.9; IV Piggyback:308.5] Out: 915 [Urine:210; Other:705] Total I/O In: 401.6 [I.V.:197; IV Piggyback:204.6] Out: 301 [Other:301]   SpO2: 100 %  tPhysical Examination:  Awake, lethargic and no focal motor deficits Respiratory: No respiratory distress, tolerating room air.  Air entry is equal bilateral and no adventitious sounds Cardiovascular: S1 and S2 are audible with no murmur Benign abdomen, feeble peristalsis.  Ileostomy and bil nephrostomy in place with clean site.  Ext: Left heel decub stage II.  Unstageable sacral decub.  Superficial abrasions right tibial chain and wasted extremities.  No peripheral edema.   Tunneled right IJ catheter for dialysis with clean site.   Left IJ TLC placed by ED physician .   CULTURE RESULTS   Recent Results (from the past 240 hour(s))  SARS Coronavirus 2 by RT PCR (hospital order, performed in [facility] hospital lab) Nasopharyngeal Nasopharyngeal Swab     Status: None   Collection Time: 10/26/2019 11:28 AM   Specimen: Nasopharyngeal Swab  Result Value Ref Range Status   SARS Coronavirus 2 NEGATIVE NEGATIVE Final    Comment: (NOTE) SARS-CoV-2 target nucleic acids are NOT DETECTED.  The SARS-CoV-2 RNA is generally detectable in upper and lower respiratory specimens during the acute phase of infection. The lowest concentration of SARS-CoV-2 viral copies this assay can detect is 250 copies / mL. A negative result does not preclude SARS-CoV-2 infection and should not be used as the sole basis for treatment or other patient management decisions.  A negative result may occur with improper specimen collection / handling, submission of specimen other than nasopharyngeal swab, presence of viral mutation(s) within the areas  targeted by this assay, and inadequate number of viral copies (<250 copies / mL). A negative result must be combined with clinical observations, patient history, and epidemiological information.  Fact Sheet for Patients:   StrictlyIdeas.no  Fact Sheet for Healthcare Providers: BankingDealers.co.za  This test is not yet approved or  cleared by the [place] FDA and has been authorized for detection and/or diagnosis of SARS-CoV-2 by FDA under an Emergency Use Authorization (EUA).  This EUA will remain in effect (meaning this test can be used) for the duration of the COVID-19 declaration under Section 564(b)(1) of the Act, 21 U.S.C. section 360bbb-3(b)(1), unless the authorization is terminated or revoked sooner.  Performed at [facility], [address] [place], [place] [address]   Blood culture (routine x 2)     Status: None  (Preliminary result)   Collection Time: 10/29/2019  4:14 PM   Specimen: BLOOD  Result Value Ref Range Status   Specimen Description BLOOD LEFT ANTECUBITAL  Final   Special Requests   Final    BOTTLES DRAWN AEROBIC AND ANAEROBIC Blood Culture adequate volume   Culture   Final    NO GROWTH < 24 HOURS Performed at [facility], [address], [place], [place] [address]    Report Status PENDING  Incomplete  Blood culture (routine x 2)     Status: None (Preliminary result)   Collection Time: 10/26/2019  4:15 PM   Specimen: BLOOD  Result Value Ref Range Status   Specimen Description BLOOD LT CL  Final   Special Requests   Final    BOTTLES DRAWN AEROBIC AND ANAEROBIC Blood Culture adequate volume   Culture   Final    NO GROWTH < 24 HOURS Performed at [facility], [address], [place], [place] [address]    Report Status PENDING  Incomplete  MRSA PCR Screening     Status: None   Collection Time: 11/05/2019  6:31 PM   Specimen: Nasopharyngeal  Result Value Ref Range Status   MRSA by PCR NEGATIVE NEGATIVE Final    Comment:        The GeneXpert MRSA Assay (FDA approved for NASAL specimens only), is one component of a comprehensive MRSA colonization surveillance program. It is not intended to diagnose MRSA infection nor to guide or monitor treatment for MRSA infections. Performed at [facility], [address], [place], [place] [address]           IMAGING    CT ABDOMEN PELVIS WO CONTRAST  Result Date: 10/31/2019 CLINICAL DATA:  64 year old female with sepsis. EXAM: CT ABDOMEN AND PELVIS WITHOUT CONTRAST TECHNIQUE: Multidetector CT imaging of the abdomen and pelvis was performed following the standard protocol without IV contrast. COMPARISON:  None. FINDINGS: Evaluation of this exam is limited in the absence of intravenous contrast. Lower chest: Small bilateral pleural effusions. Bibasilar subsegmental subpleural densities may represent atelectasis or  infiltrate. Patchy area of consolidative change with air bronchogram in the right middle lobe may represent atelectasis or infiltrate. There is no intra-abdominal free air. Small free fluid in the upper abdomen. Hepatobiliary: The liver is grossly unremarkable. No intrahepatic biliary ductal dilatation. Cholecystectomy. Pancreas: There is mild stranding adjacent to the distal pancreas which may be related to extension of fluid in the left paracolic gutter. Correlation with pancreatic enzymes recommended to exclude acute pancreatitis. Spleen: Normal in size without focal abnormality. Adrenals/Urinary Tract: Moderate to severe bilateral renal parenchyma atrophy. There are bilateral percutaneous nephrostomies with pigtail tip in  the renal pelvis. No hydronephrosis. Several small right renal cysts suboptimally characterized on this CT. There is a 3 mm nonobstructing stone versus a focus of parenchymal calcification in the inferior pole of the left kidney. The visualized ureters and urinary bladder are unremarkable. Stomach/Bowel: There is postsurgical changes of bowel resection with a right lower quadrant ileostomy. There is a moderate size parastomal hernia containing several loops of small bowel. There is no bowel obstruction. Vascular/Lymphatic: Advanced aortoiliac atherosclerotic disease. There is advanced calcification of the splenic and renal arteries. No portal venous gas. There is no adenopathy. Reproductive: Hysterectomy. Other: There is a 4.2 x 2.4 cm complex collection in the posterior pelvis (65/2) posterior to the rectum and inferior to the coccyx with extension into the skin surface posteriorly most concerning for an infected collection. This collection extends inferiorly to the skin surface along the intergluteal cleft. No definite drainable fluid collection. Musculoskeletal: There is a 4.3 x 4.9 cm soft tissue mass within the sacral bone with destruction of the bone. No acute fracture. There is small  amount of fluid with small pockets of air in the fascia posterior to the paraspinal musculature. IMPRESSION: 1. Postsurgical changes of bowel resection with a right lower quadrant ileostomy. No bowel obstruction. 2. Destructive mass involving the sacrum with areas of bony fragmentation concerning for malignancy. 3. A 4.2 x 2.4 cm complex collection in the posterior pelvis inferior to the sacral mass most concerning for an infected collection. 4. Mild stranding adjacent to the distal pancreas may be related to extension of fluid in the left paracolic gutter. Correlation with pancreatic enzymes recommended to exclude acute pancreatitis. 5. Small bilateral pleural effusions with bibasilar subsegmental atelectasis or infiltrate. Patchy area of consolidative change with air bronchogram in the right middle lobe may represent atelectasis or infiltrate. 6. Aortic Atherosclerosis (ICD10-I70.0). Electronically Signed   By: Anner Crete M.D.   On: 11/12/2019 18:15   US Venous Img Upper Uni Right(DVT)  Result Date: 10/24/2019 CLINICAL DATA:  Right upper extremity swelling for 1 month. EXAM: RIGHT UPPER EXTREMITY VENOUS DOPPLER ULTRASOUND TECHNIQUE: Gray-scale sonography with graded compression, as well as color Doppler and duplex ultrasound were performed to evaluate the upper extremity deep venous system from the level of the subclavian vein and including the jugular, axillary, basilic, radial, ulnar and upper cephalic vein. Spectral Doppler was utilized to evaluate flow at rest and with distal augmentation maneuvers. COMPARISON:  None. FINDINGS: Contralateral Subclavian Vein: Respiratory phasicity is normal and symmetric with the symptomatic side. No evidence of thrombus. Normal compressibility. Internal Jugular Vein: No evidence of thrombus. Normal compressibility, respiratory phasicity and response to augmentation. Subclavian Vein: No evidence of thrombus. Normal compressibility, respiratory phasicity and response to  augmentation. Axillary Vein: No evidence of thrombus. Normal compressibility, respiratory phasicity and response to augmentation. Cephalic Vein: No evidence of thrombus. Normal compressibility, respiratory phasicity and response to augmentation. Basilic Vein: No evidence of thrombus. Normal compressibility, respiratory phasicity and response to augmentation. Brachial Veins: No evidence of thrombus. Normal compressibility, respiratory phasicity and response to augmentation. Radial Veins: No evidence of thrombus. Normal compressibility, respiratory phasicity and response to augmentation. Ulnar Veins: No evidence of thrombus. Normal compressibility, respiratory phasicity and response to augmentation. Venous Reflux:  None visualized. Other Findings:  None visualized. IMPRESSION: No evidence of DVT within the right upper extremity. Electronically Signed   By: Inge Rise M.D.   On: 11/17/2019 15:55   ECHOCARDIOGRAM COMPLETE  Result Date: 2019-11-15    ECHOCARDIOGRAM REPORT   Patient  Name:   ADESSA PRIMIANO Date of Exam: 2019-11-16 Medical Rec #:  [id number]    Height:       61.0 in Accession #:    [id number]   Weight:       180.3 lb Date of Birth:  [birth year]     BSA:          1.807 m Patient Age:    23 years     BP:           90/61 mmHg Patient Gender: F            HR:           129 bpm. Exam Location:  [facility] Procedure: 2D Echo, Cardiac Doppler and Color Doppler Indications:     Shock  History:         Patient has no prior history of Echocardiogram examinations.                  Risk Factors:Hypertension and Diabetes. Renal disorder.  Sonographer:     Sherrie Sport RDCS (AE) Referring Phys:  [id number] Bradly Bienenstock Diagnosing Phys: Serafina Royals MD  Sonographer Comments: No parasternal window and no subcostal window. Pt was getting dialysis during echo in ICU-- she could not lie on left side. IMPRESSIONS  1. Left ventricular ejection fraction, by estimation, is <20%. The left ventricle has severely decreased function.  The left ventricle demonstrates global hypokinesis. The left ventricular internal cavity size was severely dilated. Left ventricular diastolic parameters were normal.  2. Right ventricular systolic function is normal. The right ventricular size is normal.  3. Left atrial size was mildly dilated.  4. The mitral valve is normal in structure. Mild to moderate mitral valve regurgitation.  5. The aortic valve is normal in structure. Aortic valve regurgitation is not visualized. FINDINGS  Left Ventricle: Left ventricular ejection fraction, by estimation, is <20%. The left ventricle has severely decreased function. The left ventricle demonstrates global hypokinesis. The left ventricular internal cavity size was severely dilated. There is no left ventricular hypertrophy. Left ventricular diastolic parameters were normal. Right Ventricle: The right ventricular size is normal. No increase in right ventricular wall thickness. Right ventricular systolic function is normal. Left Atrium: Left atrial size was mildly dilated. Right Atrium: Right atrial size was normal in size. Pericardium: There is no evidence of pericardial effusion. Mitral Valve: The mitral valve is normal in structure. Mild to moderate mitral valve regurgitation. Tricuspid Valve: The tricuspid valve is normal in structure. Tricuspid valve regurgitation is mild. Aortic Valve: The aortic valve is normal in structure. Aortic valve regurgitation is not visualized. Pulmonic Valve: The pulmonic valve was normal in structure. Pulmonic valve regurgitation is not visualized. Aorta: The aortic root and ascending aorta are structurally normal, with no evidence of dilitation. IAS/Shunts: No atrial level shunt detected by color flow Doppler.  LEFT VENTRICLE PLAX 2D LVIDd:         3.54 cm LVIDs:         3.44 cm LV PW:         0.65 cm LV IVS:        0.76 cm  LV Volumes (MOD) LV vol d, MOD A2C: 102.0 ml LV vol d, MOD A4C: 87.4 ml LV vol s, MOD A2C: 87.5 ml LV vol s, MOD A4C:  86.7 ml LV SV MOD A2C:     14.5 ml LV SV MOD A4C:     87.4 ml LV SV MOD BP:  9.9 ml LEFT ATRIUM             Index       RIGHT ATRIUM          Index LA diam:        3.60 cm 1.99 cm/m  RA Area:     7.31 cm LA Vol (A2C):   33.3 ml 18.42 ml/m RA Volume:   15.90 ml 8.80 ml/m LA Vol (A4C):   18.0 ml 9.96 ml/m LA Biplane Vol: 24.6 ml 13.61 ml/m  MITRAL VALVE MV Area (PHT): 6.22 cm MV Decel Time: 122 msec MV E velocity: 118.00 cm/s Serafina Royals MD Electronically signed by Serafina Royals MD Signature Date/Time: Nov 29, 2019/12:30:58 PM    Final       Assessment and plan:  Hypotension with septic shock. -Optimize albumin and vasopressors to maintain MAP more than 65 -Empiric cefepime and vancomycin.  Monitor blood cultures and lactic acid  Pneumonia.  Right lower and middle airspace disease with improved reation compared to previous radiologic exam -Received cefepime, vancomycin and levofloxacin  End-stage renal disease on HD with missed 3 sessions of dialysis.  Right tunneled IJ catheter for HD Anion gap metabolic acidosis with lactic acidemia -RRT as per nephrology  Sacral decub unstageable with sacral osteomyelitis.  Evaluated by wound care on 10/26/2019. Possible neoplastic disease of the sacrum on CT abdomen pelvis Posterior pelvic collection inferior to the sacral mass concerning for infected collection -Follow with surgery  Cardiomyopathy LVEF less than 20% with global hypokinesis on echo done 2019-11-29  Left heel ulcer stage III Superficial abrasion right tibial shin -Wound care  Uncontrolled diabetes mellitus -Optimize glycemic control, monitor POC glucose and HB A 1c.  Rule out hypothyroidism with TSH 11.5  Right upper extremity DVT was ruled out with venous Doppler study  Anemia with hemoglobin dropped 2 g within the last 24 hours  Reactive thrombocytosis  Coagulopathy.  Patient was on Eliquis  Patient had a cardiac arrest while surgical team was at the  bedside in preparation of taking her for the OR.  CODE STATUS was verified with the daughter to be full code.  Resuscitative measures were coached by Dr Robie Ridge (surgery).  Patient was intubated during resuscitation.  Patient was expired and was pronounced by Dr. Robie Ridge  Critical care time 35 min

## 2019-11-24 NOTE — Consult Note (Signed)
WOC Nurse Consult Note: Consult requested for sacrum, right shin, left heel, and ileostomy.  Performed remotely after review of progress notes in the EMR and consult note from the outpatient wound care center.  Reason for Consult: Pt was recently admitted to [facility] on 7/14 with osteomylitis and Unstageable pressure injury to sacrum.  Surgical debridement was recommended at that time but the patient and family declined.  Pt was seen at the outpatient wound care center on 8/3 for left heel and sacrum wounds. Surgical consult is pending for sacrum wound this admission; please refer to their recommendations for this location when available.  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  Sacrum with chronic Unstageable pressure injury; 6X2X2.2cm, 30% red, 10% bone, 60% yellow. Right shin is noted to have a superficial abrasion Pressure Injury POA: Yes Dressing procedure/placement/frequency:  Pt has an ileostomy in place, orders placed for supplies and bedside nurses can change PRN. Continue present plan of care as ordered by the outpatient wound care center. Topical Float heel to reduce pressure, foam dressing to left heel and right shin, change Q 3 days or PRN soiling.  Please re-consult if further assistance is needed.  Thank-you,  Julien Girt MSN, [place], [place], [place], [place]

## 2019-11-24 NOTE — Progress Notes (Signed)
*  PRELIMINARY RESULTS* Echocardiogram 2D Echocardiogram has been performed.  Wendy Wiggins 11/26/19, 9:41 AM

## 2019-11-24 NOTE — Progress Notes (Signed)
Ch arrived at room in response to [place]. Pt was being given compressions, and Pt's oldest daughter Wendy Wiggins was present waiting on her sister Wendy Wiggins. Wendy Wiggins did not want care team to stop compressions until Wendy Wiggins got here, because she wanted her to see that everything was done to save mom. Wendy Wiggins was choosing to continue compressions against doctors' advice because she is POA for Pt. Wendy Wiggins also said "I wish I had one hour to talk to her," about Pt. Wendy Wiggins arrived and called off compressions. Ch provided emotional and pastoral support. Ch stayed with Pt's daughters until after they chose the funeral home, and left the hospital.

## 2019-11-24 NOTE — Progress Notes (Signed)
Pt went into PEA cardiac arrest at approximately 1103. Dr. Lysle Pearl at the bedside. TOD 1123. See code sheet for further.

## 2019-11-24 NOTE — Consult Note (Signed)
Consultation Note Date: 11/11/2019   Patient Name: Wendy Wiggins  DOB: 01/31/1956  MRN: [id number]  Age / Sex: 64 y.o., female  PCP: System, Pcp Not In Referring Physician: Cassandria Santee, MD  Reason for Consultation: Establishing goals of care and Psychosocial/spiritual support  HPI/Patient Profile: 64 y.o. female  with past medical history of ESRD on HD for 6 months, bilateral obstructive uropathy with bilateral nephrostomy tubes, ulcerative colitis with a functional ileostomy, sacral decubitus with osteomyelitis, hypertension, history of pneumonia, morbid obesity, DM uncontrolled, anemia, admitted on 11/08/2019 with septic shock with hypotension, pneumonia, ESRD now on CVVH.   Clinical Assessment and Goals of Care: Conference with bedside nursing staff and nephrology related to patient condition, needs, CODE STATUS discussions.  Mrs. Salvatierra is lying quietly in bed.  She is morbidly obese, appears acutely/chronically ill and frail.  Currently Xarelto she is on CVVH, and 3 vasopressors with her MAP barely maintaining 65.  Her daughter, Iyania Denne is at bedside.  Mrs. Dona, Wal[place], and I talked about Mrs. Delellis's chronic illness burden, her frailty, her acute health concerns.  I share that without modern medical support at this time, Ms. Mulvaney would not be able to survive.  Guerry Minors shares that her mother's been very sick many times.  I share that, "things are looking different now", and Guerry Minors is able to agree.  Initially, Mrs. Levels tells me that she would not want life support.  Her daughter Guerry Minors comes over and asks again, and Mrs. Golightly states that she would want life support.  I asked, "for how long?"  Mrs. Reynoso tells me that she would want life support for 4 days.  I again share my worry that things are looking different for her, she is in a very tenuous state.  I asked Mrs. Mesler if her daughter Guerry Minors gets  new information, would it be okay for [place] to not put her on life support.  Mrs. Dragan clearly states that it would be okay for her daughter Guerry Minors to choose to not put her on life support.  Not long after our conversation surgery consult comes in to evaluate Mrs. Linton Rump.  Upon turning her to examine her sacral decub she codes.  Conference with daughter Guerry Minors at bedside.  Mrs. Schartz did not survive.  Support provided to staff.   HCPOA   NEXT OF KIN -daughter Zarina Pe, daughter Kanani Mowbray is secondary    SUMMARY OF RECOMMENDATIONS   We discussed CODE STATUS in detail, at this point full scope/full code   Code Status/Advance Care Planning:  Full code  Symptom Management:   Per CCM, no additional needs at this time.  Palliative Prophylaxis:   Frequent Pain Assessment, Palliative Wound Care and Turn Reposition  Additional Recommendations (Limitations, Scope, Preferences):  Full Scope Treatment  Psycho-social/Spiritual:   Desire for further Chaplaincy support:no  Additional Recommendations: Caregiving  Support/Resources and Education on Hospice  Prognosis:   < 2 weeks  Discharge Planning: To be determined, based on outcomes      Primary Diagnoses:  Present on Admission: . Septic shock ([place])   I have reviewed the medical record, interviewed the patient and family, and examined the patient. The following aspects are pertinent.  Past Medical History:  Diagnosis Date  . Acute bilateral obstructive uropathy   . Diabetes mellitus without complication ([place])   . Hypertension   . 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 Renal disorder    Social History   Socioeconomic History  . Marital status: Divorced    Spouse name: Not on file  . Number of children: Not on file  . Years of education: Not on file  . Highest education level: Not on file  Occupational History  . Not on file  Tobacco Use  . Smoking status: Never Smoker  . Smokeless tobacco: Never Used    Substance and Sexual Activity  . Alcohol use: No  . Drug use: Not on file  . Sexual activity: Not on file  Other Topics Concern  . Not on file  Social History Narrative  . Not on file   Social Determinants of Health   Financial Resource Strain:   . Difficulty of Paying Living Expenses:   Food Insecurity:   . Worried About Charity fundraiser in the Last Year:   . Arboriculturist in the Last Year:   Transportation Needs:   . Film/video editor (Medical):   Marland Kitchen Lack of Transportation (Non-Medical):   Physical Activity:   . Days of Exercise per Week:   . Minutes of Exercise per Session:   Stress:   . Feeling of Stress :   Social Connections:   . Frequency of Communication with Friends and Family:   . Frequency of Social Gatherings with Friends and Family:   . Attends Religious Services:   . Active Member of Clubs or Organizations:   . Attends Archivist Meetings:   Marland Kitchen Marital Status:    History reviewed. No pertinent family history. Scheduled Meds: . Chlorhexidine Gluconate Cloth  6 each Topical Daily  . hydrocortisone sod succinate (SOLU-CORTEF) inj  50 mg Intravenous Q6H  . insulin aspart  1-3 Units Subcutaneous Q4H  . sodium chloride flush  10-40 mL Intracatheter Q12H   Continuous Infusions: . [START ON 11/04/2019] levofloxacin (LEVAQUIN) IV    . norepinephrine (LEVOPHED) Adult infusion 22 mcg/min (02-Dec-2019 1000)  . phenylephrine (NEO-SYNEPHRINE) Adult infusion 400 mcg/min (12/02/2019 1031)  . prismasol BGK 2/2.5 dialysis solution 2,000 mL/hr at Dec 02, 2019 0801  . prismasol BGK 2/2.5 replacement solution 400 mL/hr at 10/29/2019 2220  . prismasol BGK 2/2.5 replacement solution    .  sodium bicarbonate (isotonic) infusion in sterile water 70 mL/hr at 10/30/2019 2220  . vancomycin    . vasopressin 0.04 Units/min (12/02/19 1000)   PRN Meds:.docusate sodium, fentaNYL (SUBLIMAZE) injection, heparin, ondansetron (ZOFRAN) IV, polyethylene glycol, sodium chloride, sodium  chloride flush Medications Prior to Admission:  Prior to Admission medications   Medication Sig Start Date End Date Taking? Authorizing Provider  calcium-vitamin D (OSCAL WITH D) 500-200 MG-UNIT TABS tablet Take 1 tablet by mouth daily.   Yes [provider]  cetirizine (ZYRTEC) 10 MG tablet Take by mouth.   Yes [provider]  cyanocobalamin 1000 MCG tablet Take by mouth.   Yes [provider]  ELIQUIS 5 MG TABS tablet SMARTSIG:1 Tablet(s) By Mouth Every 12 Hours 10/24/19  Yes [provider]  FERROUS SULFATE ER PO Take 65 mg by mouth daily.   Yes [provider]  folic acid (  FOLVITE) 800 MCG tablet Take by mouth.   Yes [provider]  furosemide (LASIX) 40 MG tablet Take 40 mg by mouth 2 (two) times daily. Take 40 mg. By mouth as directed for Edema. Take 40 mg. On Mon., Wed., Fri.,& Sun.   Yes [provider]  gabapentin (NEURONTIN) 100 MG capsule Take by mouth. Take 100 mg. TID On Mon., Wed., Fri.,& Sun. Take 300 mg. Tue., Thu., Sat. 09/27/19  Yes [provider]  insulin NPH-regular Human (NOVOLIN 70/30) (70-30) 100 UNIT/ML injection Inject 15 Units into the skin in the morning and at bedtime.    Yes [provider]  lansoprazole (PREVACID) 15 MG capsule Take 15 mg by mouth daily.   Yes [provider]  magnesium oxide (MAG-OX) 400 MG tablet Take 400 mg by mouth in the morning, at noon, and at bedtime.    Yes [provider]  midodrine (PROAMATINE) 10 MG tablet Take 10 mg by mouth every Tuesday, Thursday, Saturday, 1 hour prior to dialysis. 09/29/19  Yes [provider]  midodrine (PROAMATINE) 2.5 MG tablet Take 2.5 mg by mouth 3 (three) times daily. 10/08/19  Yes [provider]  multivitamin (RENA-VIT) TABS tablet Take 1 tablet by mouth daily. 09/02/19  Yes [provider]  nortriptyline (PAMELOR) 10 MG capsule Take 10 mg by mouth at bedtime. 09/27/19  Yes [provider]  predniSONE (DELTASONE) 50 MG tablet Take 50 mg by mouth as directed. Take 1 tablet 13 hours before scan, Take 1 tablet 7 hours before scan, Take 1 tablet 1 hour before scan.   Yes [provider]  primidone (MYSOLINE) 50 MG tablet Take 50 mg by mouth at bedtime. 10/11/19  Yes [provider]  sevelamer carbonate (RENVELA) 800 MG tablet Take 800 mg by mouth 3 (three) times daily with meals.   Yes [provider]  simvastatin (ZOCOR) 10 MG tablet Take 10 mg by mouth at bedtime. 10/24/19  Yes [provider]  tiZANidine (ZANAFLEX) 4 MG tablet Take 4 mg by mouth 3 (three) times daily. Hold on Ciprofloxacin 10/24/19  Yes [provider]  Turmeric 500 MG CAPS Take by mouth.   Yes [provider]  VICTOZA 18 MG/3ML SOPN Inject 0.6 mg into the skin at bedtime.  10/21/19  Yes [provider]   Allergies  Allergen Reactions  . Morphine And Related Hives and Swelling  . Dilaudid [Hydromorphone Hcl] Other (See Comments)    Hallucinations  . Keflex [Cephalexin] Hives  . Biaxin [Clarithromycin] Rash  . Penicillins Rash   Review of Systems  Unable to perform ROS: Acuity of condition    Physical Exam Vitals and nursing note reviewed.  Constitutional:      General: She is in acute distress.     Appearance: She is obese. She is ill-appearing and toxic-appearing.     Comments: Appears acutely ill, frail  Cardiovascular:     Rate and Rhythm: Normal rate.  Pulmonary:     Effort: Pulmonary effort is normal. No tachypnea.  Abdominal:     Palpations: Abdomen is soft.     Comments: Obese abdomen  Musculoskeletal:     Right lower leg: Edema present.     Left lower leg: Edema present.  Skin:    General: Skin is warm and dry.  Neurological:     Mental Status: She is alert.  Psychiatric:        Mood and Affect: Mood is not anxious.  Behavior: Behavior is not agitated.     Vital Signs: BP (!) 71/59   Pulse (!) 133    Temp 98 F (36.7 C) (Oral)   Resp (!) 38   Ht 5\' 1"  (1.549 m)   Wt 81.8 kg   SpO2 100%   BMI 34.07 kg/m  Pain Scale: 0-10   Pain Score: 10-Worst pain ever   SpO2: SpO2: 100 % O2 Device:SpO2: 100 % O2 Flow Rate: .   IO: Intake/output summary:   Intake/Output Summary (Last 24 hours) at 20-Nov-2019 1142 Last data filed at 11-20-19 1000 Gross per 24 hour  Intake 1411.08 ml  Output 1216 ml  Net 195.08 ml    LBM:   Baseline Weight: Weight: 94 kg Most recent weight: Weight: 81.8 kg     Palliative Assessment/Data:   Flowsheet Rows     Most Recent Value  Intake Tab  Referral Department Hospitalist  Unit at Time of Referral ICU  Palliative Care Primary Diagnosis Sepsis/Infectious Disease  Date Notified 11/20/2019  Reason for referral Clarify Goals of Care, End of Life Care Assistance  Date of Admission 11/19/2019  Date first seen by Palliative Care 11-20-2019  # of days Palliative referral response time 0 Day(s)  # of days IP prior to Palliative referral 1  Clinical Assessment  Palliative Performance Scale Score 10%  Pain Max last 24 hours Not able to report  Pain Min Last 24 hours Not able to report  Dyspnea Max Last 24 Hours Not able to report  Dyspnea Min Last 24 hours Not able to report  Psychosocial & Spiritual Assessment  Palliative Care Outcomes      Time In: 1130 Time Out: 1240 Time Total: 70 minutes Greater than 50%  of this time was spent counseling and coordinating care related to the above assessment and plan.  Signed by: Drue Novel, NP   Please contact Palliative Medicine Team phone at [phone] for questions and concerns.  For individual provider: See Shea Evans

## 2019-11-24 NOTE — Consult Note (Signed)
Subjective:   CC: Sacral ulcer  HPI:  Wendy Wiggins is a 64 y.o. female who was consulted by Barbie Haggis for evaluation of above.  First noted several months ago.  Symptoms include: Pain is sharp, constant, worsening. Exacerbated by nothing specific.  Alleviated by nothing specific.  Associated with discharge per report.  Previously declined surgical debridement and opted for long term abx therapy.  Doing well per daughter, but has been missing her dialysis appointments recently and that was when she was getting the abx infusions.  Admitted recently for severe sepsis and missing her dialysis.  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  Past Surgical History:  has a past surgical history that includes Ileostomy; Small intestine surgery; and Cholecystectomy.  Family History: reviewed and not relevant to CC.  Social History:  reports that she has never smoked. She has never used smokeless tobacco. She reports that she does not drink alcohol. No history on file for drug use.  Current Medications:  Prior to Admission medications   Medication Sig Start Date End Date Taking? Authorizing Provider  calcium-vitamin D (OSCAL WITH D) 500-200 MG-UNIT TABS tablet Take 1 tablet by mouth daily.   Yes [provider]  cetirizine (ZYRTEC) 10 MG tablet Take by mouth.   Yes [provider]  cyanocobalamin 1000 MCG tablet Take by mouth.   Yes [provider]  ELIQUIS 5 MG TABS tablet SMARTSIG:1 Tablet(s) By Mouth Every 12 Hours 10/24/19  Yes [provider]  FERROUS SULFATE ER PO Take 65 mg by mouth daily.   Yes [provider]  folic acid (FOLVITE) 427 MCG tablet Take by mouth.   Yes [provider]  furosemide (LASIX) 40  MG tablet Take 40 mg by mouth 2 (two) times daily. Take 40 mg. By mouth as directed for Edema. Take 40 mg. On Mon., Wed., Fri.,& Sun.   Yes [provider]  gabapentin (NEURONTIN) 100 MG capsule Take by mouth. Take 100 mg. TID On Mon., Wed., Fri.,& Sun. Take 300 mg. Tue., Thu., Sat. 09/27/19  Yes [provider]  insulin NPH-regular Human (NOVOLIN 70/30) (70-30) 100 UNIT/ML injection Inject 15 Units into the skin in the morning and at bedtime.    Yes [provider]  lansoprazole (PREVACID) 15 MG capsule Take 15 mg by mouth daily.   Yes [provider]  magnesium oxide (MAG-OX) 400 MG tablet Take 400 mg by mouth in the morning, at noon, and at bedtime.    Yes [provider]  midodrine (PROAMATINE) 10 MG tablet Take 10 mg by mouth every Tuesday, Thursday, Saturday, 1 hour prior to dialysis. 09/29/19  Yes [provider]  midodrine (PROAMATINE) 2.5 MG tablet Take 2.5 mg by mouth 3 (three) times daily. 10/08/19  Yes [provider]  multivitamin (RENA-VIT) TABS tablet Take 1 tablet by mouth daily. 09/02/19  Yes [provider]  nortriptyline (PAMELOR) 10 MG capsule Take 10 mg by mouth at bedtime. 09/27/19  Yes [provider]  predniSONE (DELTASONE) 50 MG tablet Take 50 mg by mouth as directed. Take 1 tablet 13 hours before scan, Take 1 tablet 7 hours before scan, Take 1 tablet 1 hour before scan.   Yes [provider]  primidone (MYSOLINE) 50 MG tablet Take 50 mg by mouth  at bedtime. 10/11/19  Yes [provider]  sevelamer carbonate (RENVELA) 800 MG tablet Take 800 mg by mouth 3 (three) times daily with meals.   Yes [provider]  simvastatin (ZOCOR) 10 MG tablet Take 10 mg by mouth at bedtime. 10/24/19  Yes [provider]  tiZANidine (ZANAFLEX) 4 MG tablet Take 4 mg by mouth 3 (three) times daily. Hold on Ciprofloxacin 10/24/19  Yes [provider]  Turmeric 500 MG CAPS Take by mouth.    Yes [provider]  VICTOZA 18 MG/3ML SOPN Inject 0.6 mg into the skin at bedtime.  10/21/19  Yes [provider]    Allergies:  Allergies  Allergen Reactions  . Morphine And Related Hives and Swelling  . Dilaudid [Hydromorphone Hcl] Other (See Comments)    Hallucinations  . Keflex [Cephalexin] Hives  . Biaxin [Clarithromycin] Rash  . Penicillins Rash    ROS:  Unable to obtain secondary to patient status    Objective:     BP (!) 71/59   Pulse (!) 133   Temp 98 F (36.7 C) (Oral)   Resp (!) 38   Ht 5\' 1"  (1.549 m)   Wt 81.8 kg   SpO2 100%   BMI 34.07 kg/m   Constitutional :  alert, cooperative, moderate distress and toxic  Lymphatics/Throat:  no asymmetry, masses, or scars  Respiratory:  clear to auscultation bilaterally  Cardiovascular:  tachy rate noted  Gastrointestinal: soft, non-tender; bowel sounds normal; no masses,  no organomegaly.  Musculoskeletal: Steady gait and movement  Skin: Unable to assess.  See below  Psychiatric: Normal affect, non-agitated, not confused       LABS:  CMP Latest Ref Rng & Units 11/21/19 11/05/2019 11/04/2019  Glucose 70 - 99 mg/dL 166(H) 105(H) -  BUN 8 - 23 mg/dL 23 36(H) -  Creatinine 0.44 - 1.00 mg/dL 4.04(H) 6.45(H) -  Sodium 135 - 145 mmol/L 133(L) 131(L) -  Potassium 3.5 - 5.1 mmol/L 4.2 5.5(H) -  Chloride 98 - 111 mmol/L 96(L) 96(L) -  CO2 22 - 32 mmol/L 17(L) 18(L) -  Calcium 8.9 - 10.3 mg/dL 9.0 8.7(L) -  Total Protein 6.5 - 8.1 g/dL - - 5.6(L)  Total Bilirubin 0.3 - 1.2 mg/dL - - 1.3(H)  Alkaline Phos 38 - 126 U/L - - 470(H)  AST 15 - 41 U/L - - 24  ALT 0 - 44 U/L - - 14   CBC Latest Ref Rng & Units 11/21/2019 11/07/2019 11/19/2019  WBC 4.0 - 10.5 K/uL 20.0(H) 32.0(H) 29.2(H)  Hemoglobin 12.0 - 15.0 g/dL 7.1(L) 9.3(L) 9.2(L)  Hematocrit 36 - 46 % 21.7(L) 29.4(L) 29.1(L)  Platelets 150 - 400 K/uL 405(H) 464(H) 386    RADS: CLINICAL DATA:  64 year old female with sepsis.  EXAM: CT ABDOMEN AND  PELVIS WITHOUT CONTRAST  TECHNIQUE: Multidetector CT imaging of the abdomen and pelvis was performed following the standard protocol without IV contrast.  COMPARISON:  None.  FINDINGS: Evaluation of this exam is limited in the absence of intravenous contrast.  Lower chest: Small bilateral pleural effusions. Bibasilar subsegmental subpleural densities may represent atelectasis or infiltrate. Patchy area of consolidative change with air bronchogram in the right middle lobe may represent atelectasis or infiltrate.  There is no intra-abdominal free air. Small free fluid in the upper abdomen.  Hepatobiliary: The liver is grossly unremarkable. No intrahepatic biliary ductal dilatation. Cholecystectomy.  Pancreas: There is mild stranding adjacent to the distal pancreas which may be related to extension of  fluid in the left paracolic gutter. Correlation with pancreatic enzymes recommended to exclude acute pancreatitis.  Spleen: Normal in size without focal abnormality.  Adrenals/Urinary Tract: Moderate to severe bilateral renal parenchyma atrophy. There are bilateral percutaneous nephrostomies with pigtail tip in the renal pelvis. No hydronephrosis. Several small right renal cysts suboptimally characterized on this CT. There is a 3 mm nonobstructing stone versus a focus of parenchymal calcification in the inferior pole of the left kidney. The visualized ureters and urinary bladder are unremarkable.  Stomach/Bowel: There is postsurgical changes of bowel resection with a right lower quadrant ileostomy. There is a moderate size parastomal hernia containing several loops of small bowel. There is no bowel obstruction.  Vascular/Lymphatic: Advanced aortoiliac atherosclerotic disease. There is advanced calcification of the splenic and renal arteries. No portal venous gas. There is no adenopathy.  Reproductive: Hysterectomy.  Other: There is a 4.2 x 2.4 cm complex  collection in the posterior pelvis (65/2) posterior to the rectum and inferior to the coccyx with extension into the skin surface posteriorly most concerning for an infected collection. This collection extends inferiorly to the skin surface along the intergluteal cleft. No definite drainable fluid collection.  Musculoskeletal: There is a 4.3 x 4.9 cm soft tissue mass within the sacral bone with destruction of the bone. No acute fracture. There is small amount of fluid with small pockets of air in the fascia posterior to the paraspinal musculature.  IMPRESSION: 1. Postsurgical changes of bowel resection with a right lower quadrant ileostomy. No bowel obstruction. 2. Destructive mass involving the sacrum with areas of bony fragmentation concerning for malignancy. 3. A 4.2 x 2.4 cm complex collection in the posterior pelvis inferior to the sacral mass most concerning for an infected collection. 4. Mild stranding adjacent to the distal pancreas may be related to extension of fluid in the left paracolic gutter. Correlation with pancreatic enzymes recommended to exclude acute pancreatitis. 5. Small bilateral pleural effusions with bibasilar subsegmental atelectasis or infiltrate. Patchy area of consolidative change with air bronchogram in the right middle lobe may represent atelectasis or infiltrate. 6. Aortic Atherosclerosis (ICD10-I70.0).   Electronically Signed   By: Anner Crete M.D.   On: 11/20/2019 18:15   Assessment:      Sepsis with sacral wound  Plan:    During physical exam portion, we attempted to roll the patient to her side in order to assess her sacral wound.  Right before rolling her, she was noted to become unresponsive to firm sternal rub despite maintaining tachycardia on the monitor.  Pulse check noted in absence of pulse, so CODE BLUE was initiated for PEA.  I assume the role of code leader and initiated ACLS.  Please see code chart for details of  the code process.  Unfortunately, ROSC was not obtained in the end and code was terminated, time of death was called by myself after agreement from the entire team as well as the daughters who were at bedside by the end of the process.  I ensured that they did not have any further questions for me, and transferred daughter's care to the chaplain staff and ICU team.

## 2019-11-24 NOTE — Discharge Summary (Signed)
Physician Discharge Summary  Patient ID: Wendy Wiggins MRN: [id number] DOB/AGE: 1955-07-01 64 y.o.  Admit date: 11/07/2019 Discharge date: Nov 13, 2019  Course of admission: Wendy Wiggins is 64 years old lady with a history of end-stage renal disease on hemodialysis, bilateral obstructive uropathy on bilateral nephrostomy, ulcerative colitis on the functional ileostomy, sacral decubitus osteomyelitis, hypertension.  The patient was admitted to the hospital on 11/19/2019 after she missed 3 sessions of hemodialysis.  She was found to be in septic shock with increased pressor requirement.  She was started on CVVHD as per nephrology, surgery was consulted for sacral ulcer debridement.  Wound surgical team at the bedside escorted the patient to the OR patient had a cardiac arrest resuscitation measures was coached by Dr. Robie Ridge (surgery).  CODE STATUS was verified with the patient daughter to be full code and the patient was intubated during resuscitation.  Patient expired despite of resuscitative measures.  She was pronounced by Dr. Robie Ridge  Discharge Diagnoses:  Cardiac arrest Septic shock End-stage renal disease on hemodialysis was missed 3 dialysis sessions Pneumonia Unstageable sacral decub with sacral osteomyelitis Uncontrolled diabetes mellitus Anemia   Discharge Plan: Patient expired                  DISCHARGE SUMMARY   Wendy Wiggins is a 64 y.o. y/o female with a PMH of                  Discharge Exam: Patient expired  Vitals:   13-Nov-2019 0700 Nov 13, 2019 0800 13-Nov-2019 0900 13-Nov-2019 1000  BP: 90/61 (!) 79/55 (!) 61/16 (!) 71/59  Pulse: (!) 129 (!) 134 (!) 133   Resp: (!) 21 (!) 25 (!) 32 (!) 38  Temp:  98 F (36.7 C)    TempSrc:  Oral    SpO2: 100% 100% 100%   Weight:      Height:         Discharge Labs  BMET Recent Labs  Lab 11/16/2019 1054 11/04/2019 1054 11/12/2019 1614 10/24/2019 2211 2019/11/13 0450  NA 126*  --   --  131* 133*  K 5.3*   < >  --  5.5* 4.2  CL 93*  --   --  96*  96*  CO2 13*  --   --  18* 17*  GLUCOSE 168*  --   --  105* 166*  BUN 35*  --   --  36* 23  CREATININE 6.37*  --  6.56* 6.45* 4.04*  CALCIUM 8.3*  --   --  8.7* 9.0  MG  --   --   --   --  2.0  PHOS  --   --   --   --  4.7*   < > = values in this interval not displayed.    CBC Recent Labs  Lab 11/11/2019 1054 11/03/2019 1614 2019/11/13 0450  HGB 9.2* 9.3* 7.1*  HCT 29.1* 29.4* 21.7*  WBC 29.2* 32.0* 20.0*  PLT 386 464* 405*    Anti-Coagulation Recent Labs  Lab 10/31/2019 1614 Nov 13, 2019 0450  INR 4.2* 7.0*              Disposition: patient expired  Discharged Condition: Patient expired on 2019-11-13  Time spent on disposition:  less than 35 minutes.

## 2019-11-24 DEATH — deceased

## 2022-01-16 IMAGING — US US EXTREM  UP VENOUS*R*
1 series · 13 of 24 positions shown · non-contrast
Comparison: None.

CLINICAL DATA: Right upper extremity swelling for 1 month.



[Series 1: us venous img upper uni right (dvt) · portal-venous · 13 of 34 slices shown]
[im 1/34]
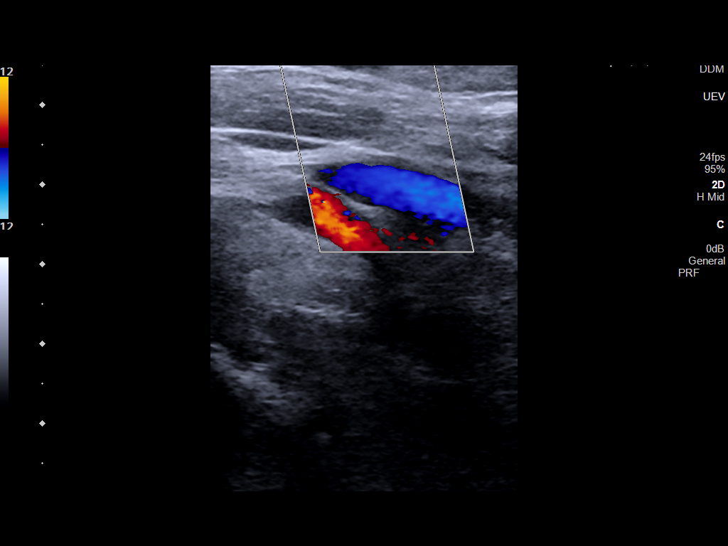
[im 3/34]
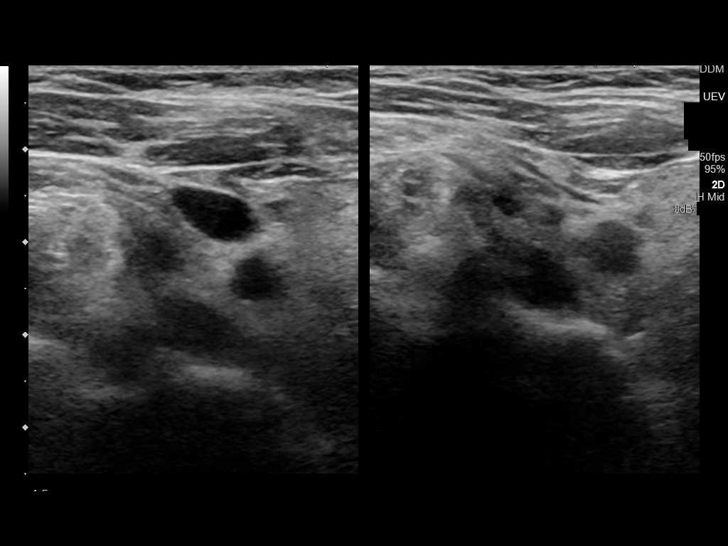
[im 6/34]
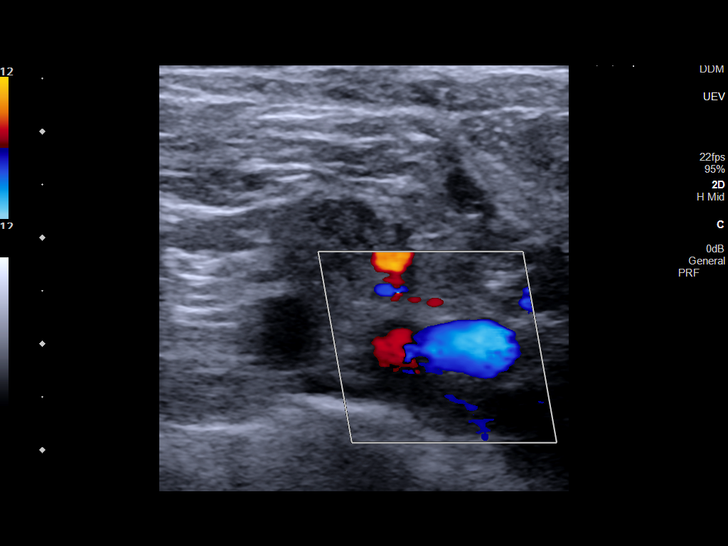
[im 9/34]
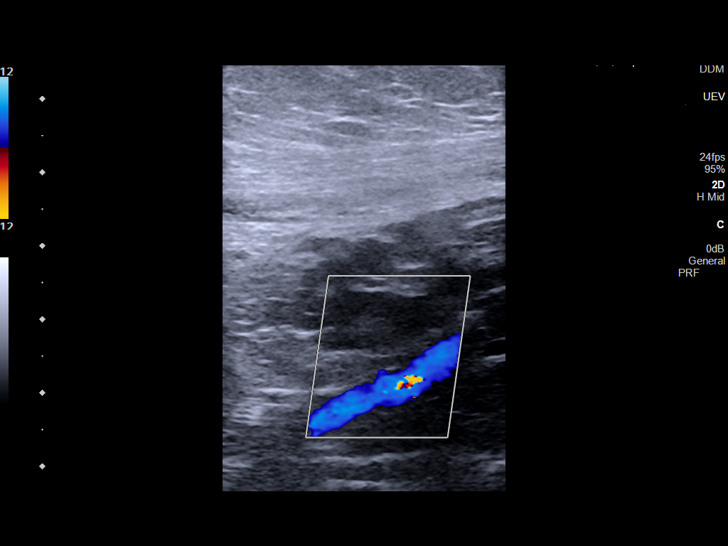
[im 12/34]
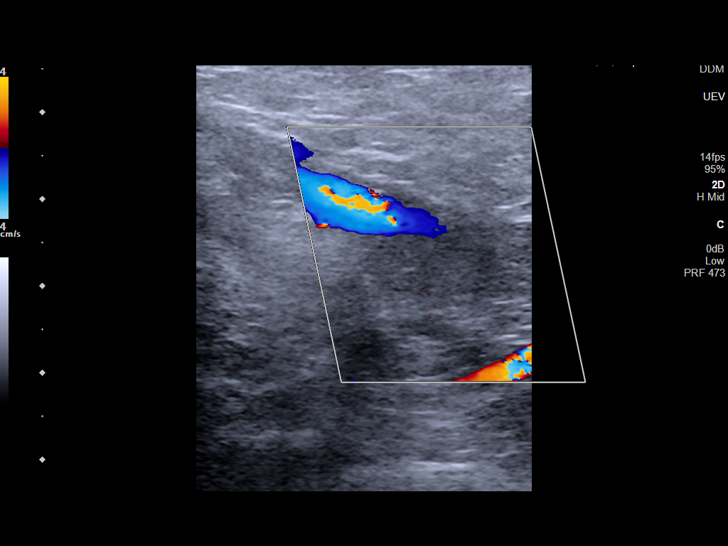
[im 15/34]
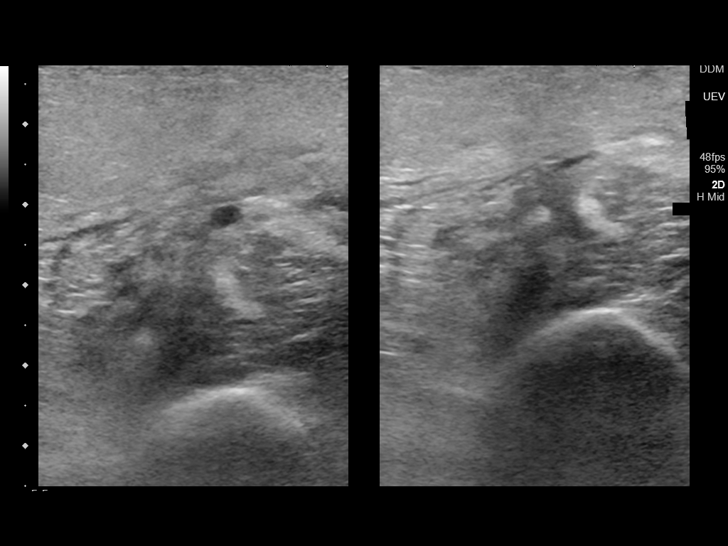
[im 18/34]
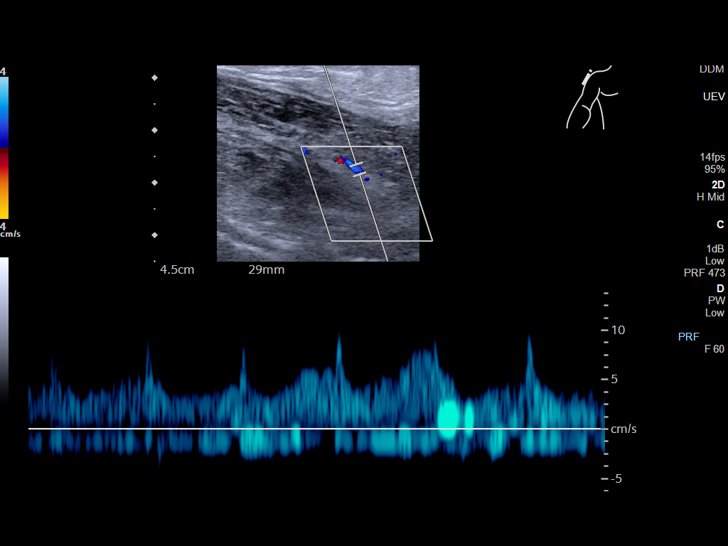
[im 19/34]
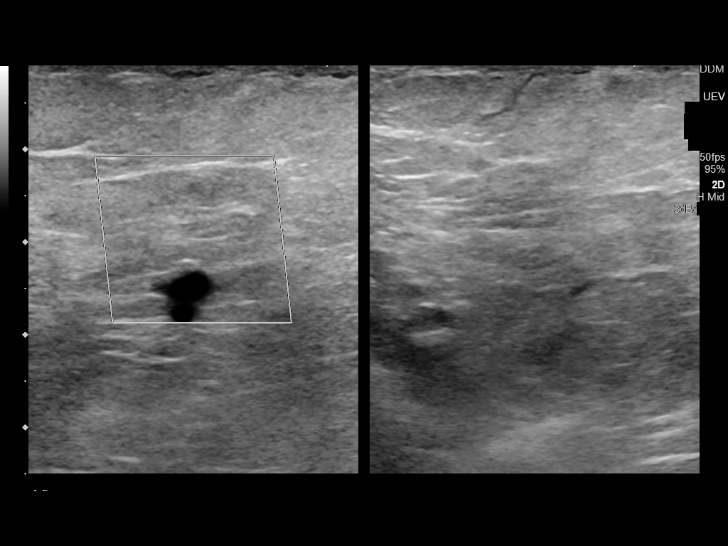
[im 22/34]
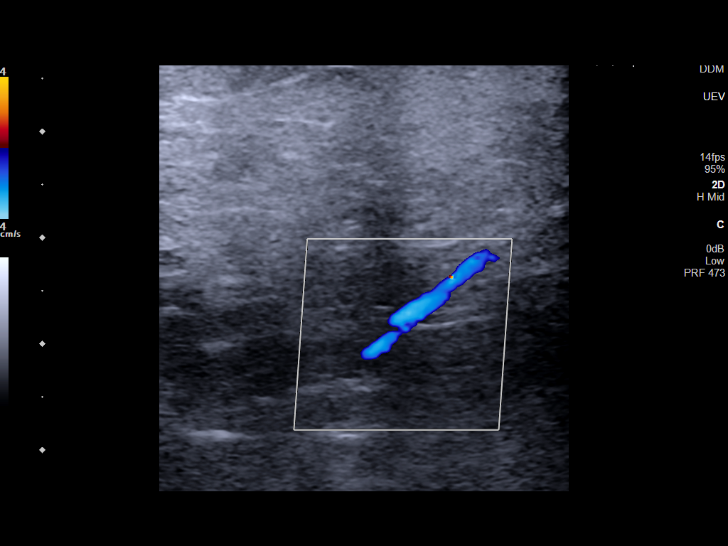
[im 25/34]
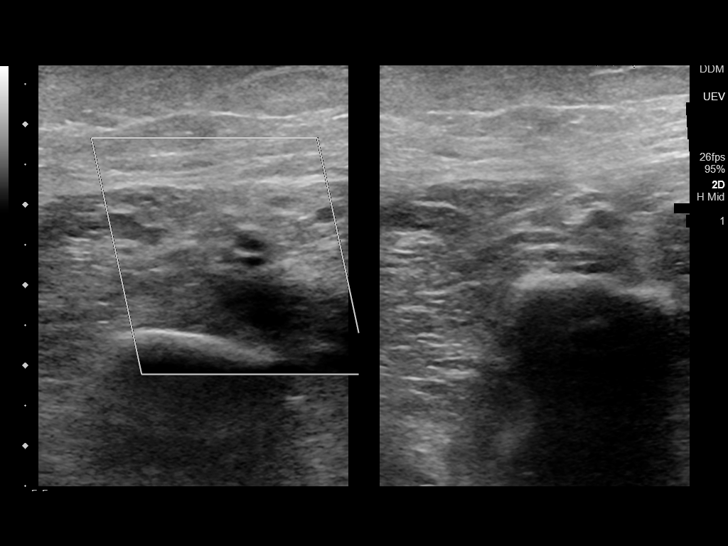
[im 28/34]
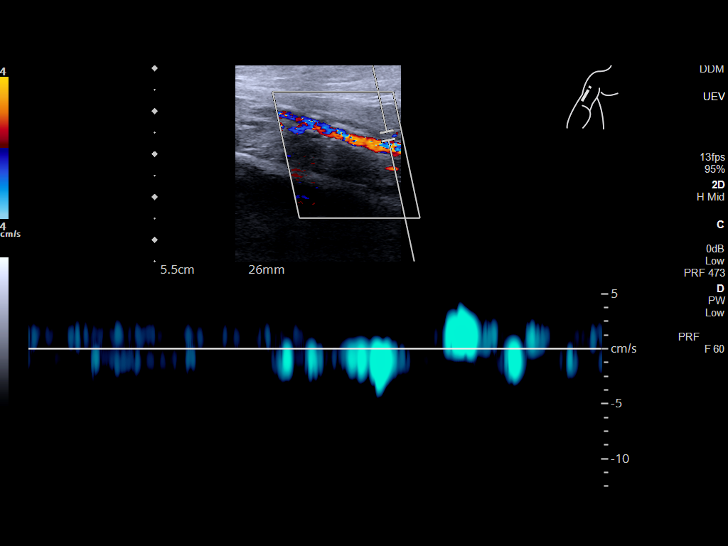
[im 31/34]
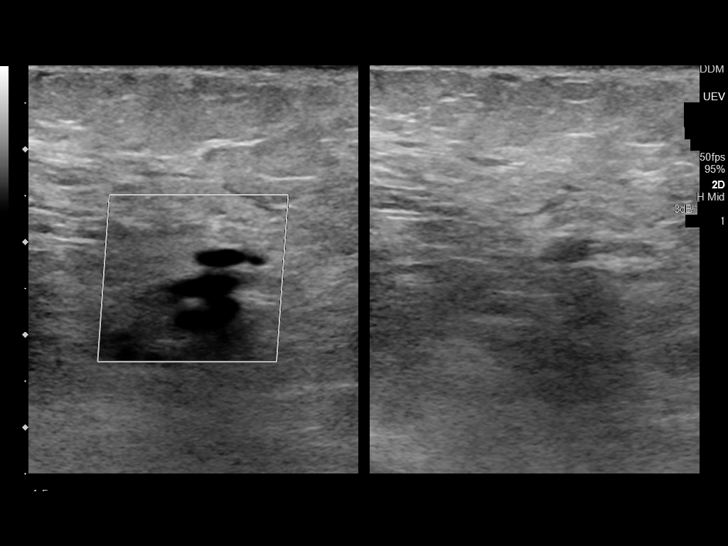
[im 34/34]
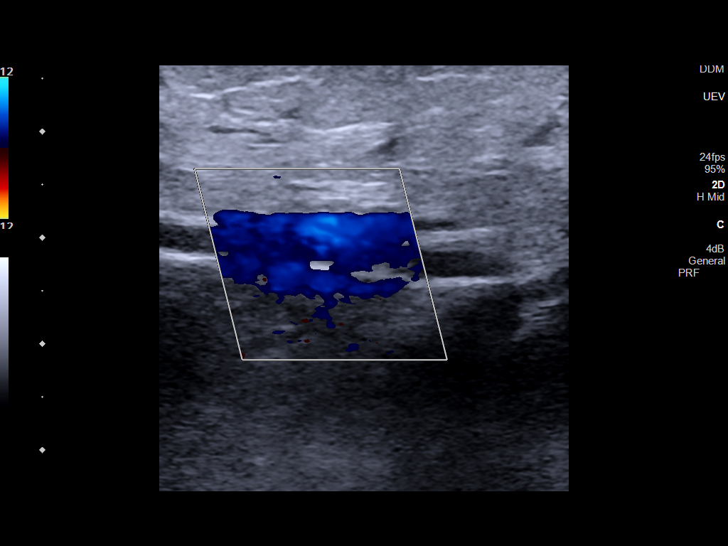

[13 of 24 positions shown; findings below may reference images not displayed]

FINDINGS: Contralateral Subclavian Vein: Respiratory phasicity is normal and
symmetric with the symptomatic side. No evidence of thrombus. Normal
compressibility.

Internal Jugular Vein: No evidence of thrombus. Normal
compressibility, respiratory phasicity and response to augmentation.

Subclavian Vein: No evidence of thrombus. Normal compressibility,
respiratory phasicity and response to augmentation.

Axillary Vein: No evidence of thrombus. Normal compressibility,
respiratory phasicity and response to augmentation.

Cephalic Vein: No evidence of thrombus. Normal compressibility,
respiratory phasicity and response to augmentation.

Basilic Vein: No evidence of thrombus. Normal compressibility,
respiratory phasicity and response to augmentation.

Brachial Veins: No evidence of thrombus. Normal compressibility,
respiratory phasicity and response to augmentation.

Radial Veins: No evidence of thrombus. Normal compressibility,
respiratory phasicity and response to augmentation.

Ulnar Veins: No evidence of thrombus. Normal compressibility,
respiratory phasicity and response to augmentation.

Venous Reflux:  None visualized.

Other Findings:  None visualized.
IMPRESSION: No evidence of DVT within the right upper extremity.

## 2022-01-16 IMAGING — DX DG CHEST 1V PORT
1 series · 1 of 1 positions shown · non-contrast
Comparison: Single-view of the chest earlier today.

CLINICAL DATA: Status post central line placement today.

EXAM:
PORTABLE CHEST 1 VIEW

[chest ap]
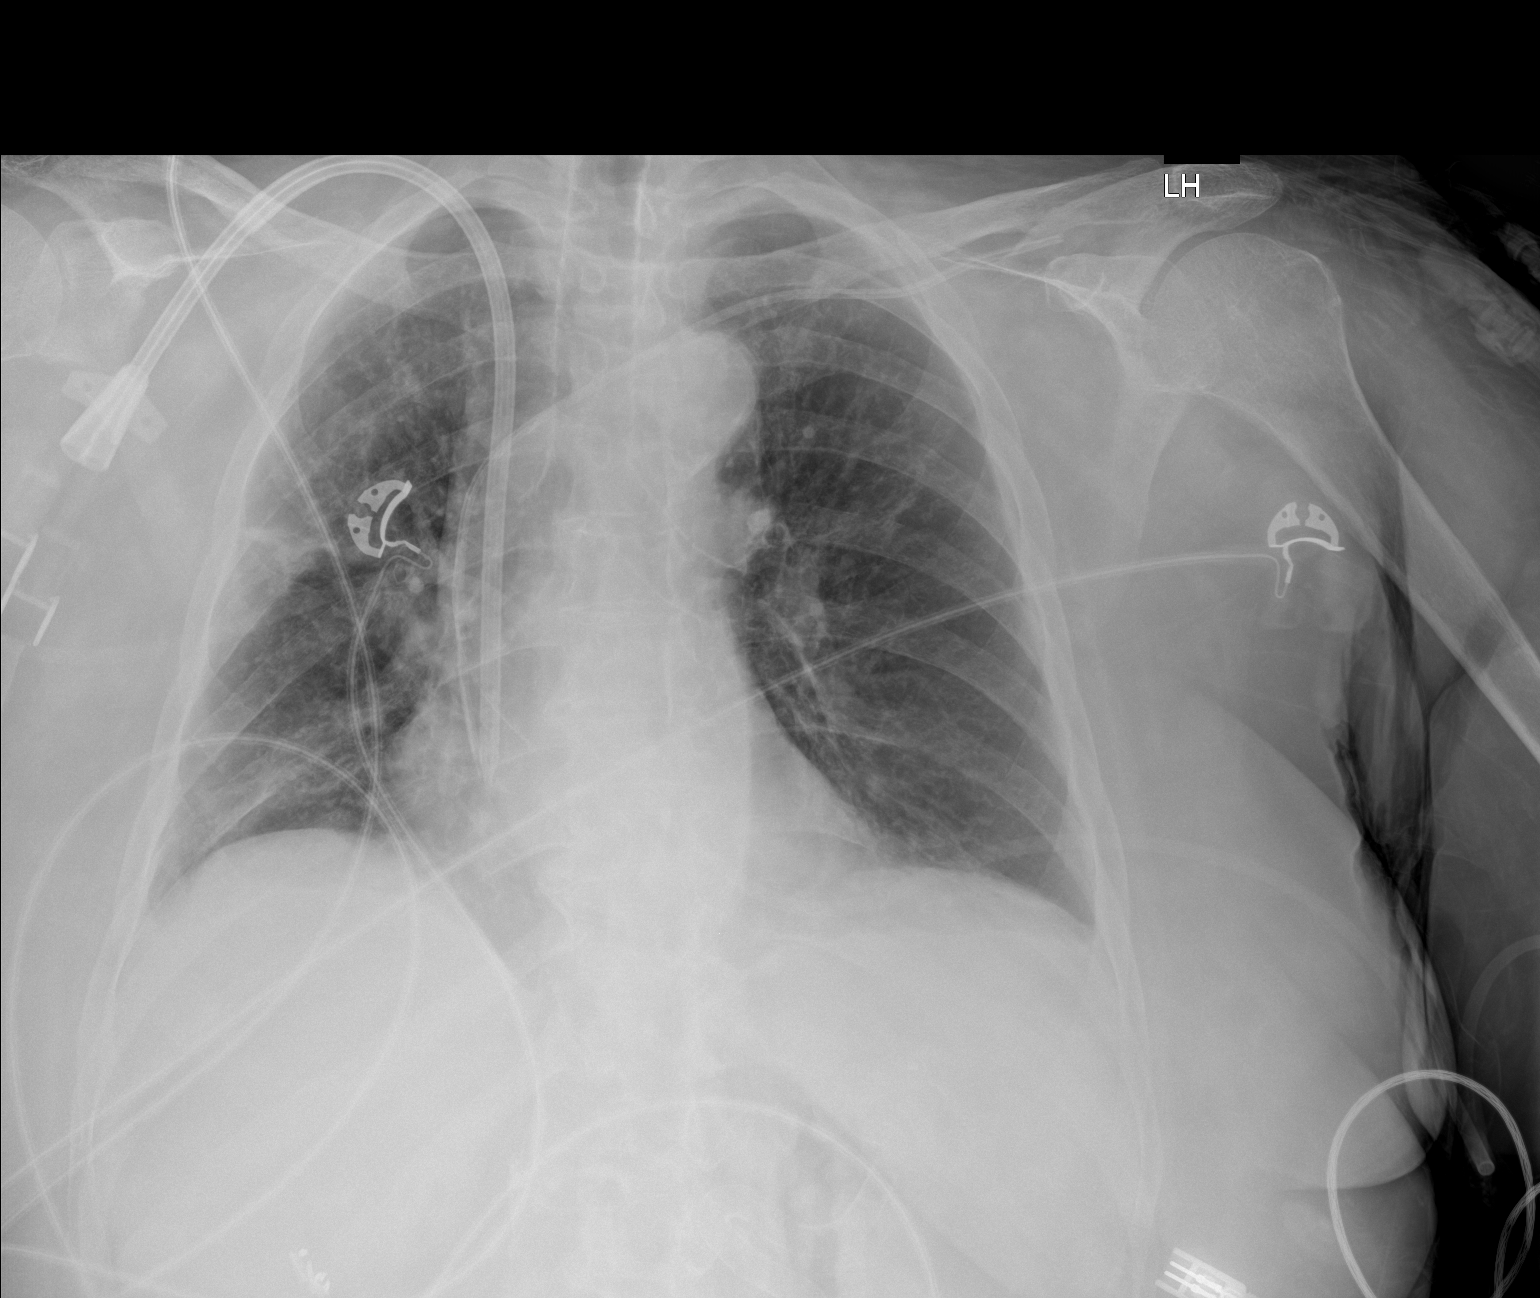

[1 of 1 positions shown; findings below may reference images not displayed]

FINDINGS: A new left subclavian central venous catheter is in place with the
tip projecting near the superior cavoatrial junction. Right dialysis
catheter is unchanged. No pneumothorax. Trace bilateral pleural
effusions. Small focus of atelectasis in the right mid lung again
seen. Right basilar atelectasis has improved. Heart size normal.
Aortic atherosclerosis.
IMPRESSION: Left subclavian central venous catheter tip projects at the superior
cavoatrial junction. Negative for pneumothorax.

Improved right basilar atelectasis.
